# Patient Record
Sex: Female | Born: 1960 | Race: White | Hispanic: No | Marital: Married | Smoking: Never smoker
Health system: Southern US, Community
[De-identification: ages and names within clinical notes are randomized; demographics above are authoritative.]

## PROBLEM LIST (undated history)

## (undated) DIAGNOSIS — M8580 Other specified disorders of bone density and structure, unspecified site: Secondary | ICD-10-CM

## (undated) DIAGNOSIS — N951 Menopausal and female climacteric states: Secondary | ICD-10-CM

## (undated) DIAGNOSIS — F419 Anxiety disorder, unspecified: Secondary | ICD-10-CM

## (undated) DIAGNOSIS — F32A Depression, unspecified: Secondary | ICD-10-CM

## (undated) DIAGNOSIS — E119 Type 2 diabetes mellitus without complications: Secondary | ICD-10-CM

## (undated) DIAGNOSIS — E785 Hyperlipidemia, unspecified: Secondary | ICD-10-CM

## (undated) DIAGNOSIS — E669 Obesity, unspecified: Secondary | ICD-10-CM

## (undated) DIAGNOSIS — G4700 Insomnia, unspecified: Secondary | ICD-10-CM

## (undated) DIAGNOSIS — G43909 Migraine, unspecified, not intractable, without status migrainosus: Secondary | ICD-10-CM

## (undated) DIAGNOSIS — Z973 Presence of spectacles and contact lenses: Secondary | ICD-10-CM

## (undated) DIAGNOSIS — IMO0002 Reserved for concepts with insufficient information to code with codable children: Secondary | ICD-10-CM

## (undated) DIAGNOSIS — K219 Gastro-esophageal reflux disease without esophagitis: Secondary | ICD-10-CM

## (undated) DIAGNOSIS — M1990 Unspecified osteoarthritis, unspecified site: Secondary | ICD-10-CM

## (undated) DIAGNOSIS — I10 Essential (primary) hypertension: Secondary | ICD-10-CM

## (undated) HISTORY — DX: Insomnia, unspecified: G47.00

## (undated) HISTORY — DX: Type 2 diabetes mellitus without complications: E11.9

## (undated) HISTORY — DX: Other specified disorders of bone density and structure, unspecified site: M85.80

## (undated) HISTORY — DX: Depression, unspecified: F32.A

## (undated) HISTORY — DX: Obesity, unspecified: E66.9

## (undated) HISTORY — DX: Menopausal and female climacteric states: N95.1

## (undated) HISTORY — DX: Gastro-esophageal reflux disease without esophagitis: K21.9

## (undated) HISTORY — DX: Anxiety disorder, unspecified: F41.9

---

## 1998-01-27 ENCOUNTER — Other Ambulatory Visit: Admission: RE | Admit: 1998-01-27 | Discharge: 1998-01-27 | Payer: Self-pay | Admitting: Gynecology

## 2002-11-09 ENCOUNTER — Other Ambulatory Visit: Admission: RE | Admit: 2002-11-09 | Discharge: 2002-11-09 | Payer: Self-pay | Admitting: Gynecology

## 2004-01-11 ENCOUNTER — Other Ambulatory Visit: Admission: RE | Admit: 2004-01-11 | Discharge: 2004-01-11 | Payer: Self-pay | Admitting: Gynecology

## 2006-01-10 ENCOUNTER — Emergency Department (HOSPITAL_COMMUNITY): Admission: EM | Admit: 2006-01-10 | Discharge: 2006-01-10 | Payer: Self-pay | Admitting: Emergency Medicine

## 2007-02-05 ENCOUNTER — Other Ambulatory Visit: Admission: RE | Admit: 2007-02-05 | Discharge: 2007-02-05 | Payer: Self-pay | Admitting: Gynecology

## 2008-02-11 ENCOUNTER — Other Ambulatory Visit: Admission: RE | Admit: 2008-02-11 | Discharge: 2008-02-11 | Payer: Self-pay | Admitting: Gynecology

## 2008-08-26 ENCOUNTER — Encounter: Admission: RE | Admit: 2008-08-26 | Discharge: 2008-08-26 | Payer: Self-pay | Admitting: Family Medicine

## 2009-12-13 ENCOUNTER — Ambulatory Visit (HOSPITAL_BASED_OUTPATIENT_CLINIC_OR_DEPARTMENT_OTHER): Admission: RE | Admit: 2009-12-13 | Discharge: 2009-12-13 | Payer: Self-pay | Admitting: Orthopedic Surgery

## 2009-12-13 HISTORY — PX: OTHER SURGICAL HISTORY: SHX169

## 2010-01-24 ENCOUNTER — Ambulatory Visit: Payer: Self-pay | Admitting: Family Medicine

## 2010-01-24 DIAGNOSIS — M76899 Other specified enthesopathies of unspecified lower limb, excluding foot: Secondary | ICD-10-CM | POA: Insufficient documentation

## 2010-01-24 DIAGNOSIS — M169 Osteoarthritis of hip, unspecified: Secondary | ICD-10-CM

## 2010-01-24 DIAGNOSIS — IMO0002 Reserved for concepts with insufficient information to code with codable children: Secondary | ICD-10-CM | POA: Insufficient documentation

## 2010-02-03 ENCOUNTER — Encounter: Admission: RE | Admit: 2010-02-03 | Discharge: 2010-02-03 | Payer: Self-pay | Admitting: Family Medicine

## 2010-04-25 NOTE — Assessment & Plan Note (Signed)
Summary: NP,RT OUTSIDE THIGH PAIN,MC   Vital Signs:  Patient profile:   50 year old female Height:      61 inches Weight:      162 pounds BMI:     30.72 Pulse rate:   92 / minute BP sitting:   115 / 76  (left arm)  Vitals Entered By: Rochele Pages RN (January 24, 2010 1:59 PM) CC: rt thigh pain x 3 weeks   History of Present Illness: 50 year old female seen at the request of Dr. Philipp Deputy from [place] for evaluation of right thigh and hip pain for 3 weeks.   The patient was lifting some boxes about 3 weeks ago, and she felt some pain in her hip more in the anterior aspect. Now she is having lateral hip pain as well, deep and aching, some distributed in and around just distal to lateral thigh.  No trauma, bruising, or swelling. Worse with standing and swelling. Heat and ice have not been helpful.  She is not clear about what medications she is taking and does not have bottles.   No history of fracture or operative intervention in the effected hip. She does describe deep groin pain on the right.   No back pain. No radiculopathy.  Allergies (verified): No Known Drug Allergies  Family History: n/c  Review of Systems       REVIEW OF SYSTEMS  GEN: No systemic complaints, no fevers, chills, sweats, or other acute illnesses MSK: Detailed in the HPI GI: tolerating PO intake without difficulty Neuro: No numbness, parasthesias, or tingling associated. Otherwise the pertinent positives of the ROS are noted above.    Physical Exam  General:  Well-developed,well-nourished,in no acute distress; alert,appropriate and cooperative throughout examination Head:  Normocephalic and atraumatic without obvious abnormalities. No apparent alopecia or balding. Ears:  no external deformities.   Nose:  no external deformity.   Lungs:  normal respiratory effort.   Msk:  Lumbar spine: Full flexion, extension, lateral rotation, lateral bending. NT sciatic notches. NT along the spinous processes. Neg  SLR.   HIP EXAM: SIDE: R ROM: Abduction, Flexion, Internal and External range of motion:Notable pain with terminal IROM Flexion 5-10 degrees greater on L No pain with abduction B Pain in deep hip and anteriorly with IROM GTB: markedly TTP SLR: NEG Knees: No effusion FABER: NT REVERSE FABER: Limited some with IROM pain Str: flexion: 4+/5 abduction: 4-/5, severe pain adduction: 4/5, causes pain Neurologic:  alert & oriented X3.  antalgic gait   Impression & Recommendations:  Problem # 1:  HIP STRAIN, RIGHT (ICD-843.9) Assessment New Combination of multiple pathologies causing pain. Initially, suspect hip flexor strain, ttp at insertion, mildly. + C sign with pain c/w some intraarticular pathology.  Trochanteric bursitis is likely secondary, but now seems primary main cause of pain.  Recommendations: Patient to call back with meds she is using - may change antiinflammatory class.  Reviewed hip rehab protocol with the patient.  GTB injection. Plain xrays to evaluate joint space.  recheck 3-4 weeks  cc: Dr. Clelia Croft  Problem # 2:  TROCHANTERIC BURSITIS, RIGHT (ICD-726.5) Assessment: New Trochanteric Bursitis Injection Verbal consent obtained. Risks, benefits, and alternatives reviewed. R greater trochanter sterilely prepped with Betadine. Ethyl Chloride used for anesthesia. 4 cc of Lidocaine 1% injected with 1 cc of 40 mg Kenalog into trochanteric bursa at area of maximal tenderness at greater trochanter. Needle taken to bone to troch bursa, flows easily. Bursa massaged. No bleeding and no complications.  Decreased pain after injection. Needle: 22 gauge spinal needle   Problem # 3:  OSTEOARTHRITIS, HIP, RIGHT (ICD-715.95) Assessment: New  Orders: Joint Aspirate / Injection, Large (20610) Kenalog 10mg  (4units) (J3301)   Orders Added: 1)  Consultation Level III [95284] 2)  Joint Aspirate / Injection, Large [20610] 3)  Kenalog 10mg  (4units) [J3301]

## 2010-04-25 NOTE — Letter (Signed)
Summary: [facility]   Imported By: Marily Memos 01/24/2010 13:48:26  _____________________________________________________________________  External Attachment:    Type:   Image     Comment:   External Document

## 2010-06-08 LAB — BASIC METABOLIC PANEL
Chloride: 98 mEq/L (ref 96–112)
GFR calc non Af Amer: >60 mL/min (ref >60)
Potassium: 4.3 mEq/L (ref 3.5–5.1)
Sodium: 133 mEq/L — ABNORMAL LOW (ref 135–145)

## 2010-06-08 LAB — POCT HEMOGLOBIN-HEMACUE: Hemoglobin: 12.9 g/dL (ref 12.0–15.0)

## 2014-12-21 ENCOUNTER — Encounter (HOSPITAL_BASED_OUTPATIENT_CLINIC_OR_DEPARTMENT_OTHER): Payer: Self-pay | Admitting: *Deleted

## 2014-12-21 NOTE — Progress Notes (Signed)
NPO AFTER MN WITH EXCEPTION CLEAR LIQUIDS UNTIL 0700 (NO CREAM/MILK PRODUCTS).  ARRIVE AT 1130.  NEEDS ISTAT AND EKG. WILL TAKE AM MEDS W/ SIPS OF WATER.

## 2014-12-30 ENCOUNTER — Ambulatory Visit (HOSPITAL_BASED_OUTPATIENT_CLINIC_OR_DEPARTMENT_OTHER): Payer: BLUE CROSS/BLUE SHIELD | Admitting: Anesthesiology

## 2014-12-30 ENCOUNTER — Encounter (HOSPITAL_BASED_OUTPATIENT_CLINIC_OR_DEPARTMENT_OTHER)
Admission: RE | Disposition: A | Discharge status: Home / Self Care | Payer: Self-pay | Source: Ambulatory Visit | Attending: Orthopedic Surgery

## 2014-12-30 ENCOUNTER — Ambulatory Visit (HOSPITAL_BASED_OUTPATIENT_CLINIC_OR_DEPARTMENT_OTHER)
Admission: RE | Admit: 2014-12-30 | Discharge: 2014-12-30 | Disposition: A | Discharge status: Home / Self Care | Payer: BLUE CROSS/BLUE SHIELD | Source: Ambulatory Visit | Attending: Orthopedic Surgery | Admitting: Orthopedic Surgery

## 2014-12-30 ENCOUNTER — Encounter (HOSPITAL_BASED_OUTPATIENT_CLINIC_OR_DEPARTMENT_OTHER): Payer: Self-pay

## 2014-12-30 DIAGNOSIS — G43909 Migraine, unspecified, not intractable, without status migrainosus: Secondary | ICD-10-CM | POA: Diagnosis not present

## 2014-12-30 DIAGNOSIS — E785 Hyperlipidemia, unspecified: Secondary | ICD-10-CM | POA: Insufficient documentation

## 2014-12-30 DIAGNOSIS — I10 Essential (primary) hypertension: Secondary | ICD-10-CM | POA: Diagnosis not present

## 2014-12-30 DIAGNOSIS — M1990 Unspecified osteoarthritis, unspecified site: Secondary | ICD-10-CM | POA: Diagnosis not present

## 2014-12-30 DIAGNOSIS — M20012 Mallet finger of left finger(s): Secondary | ICD-10-CM | POA: Insufficient documentation

## 2014-12-30 HISTORY — DX: Hyperlipidemia, unspecified: E78.5

## 2014-12-30 HISTORY — DX: Migraine, unspecified, not intractable, without status migrainosus: G43.909

## 2014-12-30 HISTORY — DX: Essential (primary) hypertension: I10

## 2014-12-30 HISTORY — PX: CLOSED REDUCTION FINGER WITH PERCUTANEOUS PINNING: SHX5612

## 2014-12-30 HISTORY — DX: Presence of spectacles and contact lenses: Z97.3

## 2014-12-30 HISTORY — DX: Reserved for concepts with insufficient information to code with codable children: IMO0002

## 2014-12-30 HISTORY — DX: Unspecified osteoarthritis, unspecified site: M19.90

## 2014-12-30 LAB — POCT I-STAT 4, (NA,K, GLUC, HGB,HCT)
GLUCOSE: 103 mg/dL — AB (ref 65–99)
HCT: 42 % (ref 36.0–46.0)
Hemoglobin: 14.3 g/dL (ref 12.0–15.0)
Potassium: 3.9 mmol/L (ref 3.5–5.1)
Sodium: 138 mmol/L (ref 135–145)

## 2014-12-30 SURGERY — CLOSED REDUCTION, FINGER, WITH PERCUTANEOUS PINNING
Anesthesia: General | Site: Finger | Laterality: Left

## 2014-12-30 MED ORDER — LACTATED RINGERS IV SOLN
INTRAVENOUS | Status: DC
  Administered 2014-12-30: 12:00:00 via INTRAVENOUS
  Filled 2014-12-30: qty 1000

## 2014-12-30 MED ORDER — CHLORHEXIDINE GLUCONATE 4 % EX LIQD
60.0000 mL | Freq: Once | CUTANEOUS | Status: DC
  Filled 2014-12-30: qty 60

## 2014-12-30 MED ORDER — CEFAZOLIN SODIUM-DEXTROSE 2-3 GM-% IV SOLR
INTRAVENOUS | Status: AC
  Filled 2014-12-30: qty 50

## 2014-12-30 MED ORDER — LACTATED RINGERS IV SOLN
INTRAVENOUS | Status: DC
  Filled 2014-12-30: qty 1000

## 2014-12-30 MED ORDER — BUPIVACAINE HCL (PF) 0.25 % IJ SOLN
INTRAMUSCULAR | Status: DC | PRN
  Administered 2014-12-30: 5 mL

## 2014-12-30 MED ORDER — LIDOCAINE HCL (CARDIAC) 20 MG/ML IV SOLN
INTRAVENOUS | Status: DC | PRN
  Administered 2014-12-30: 50 mg via INTRAVENOUS

## 2014-12-30 MED ORDER — MIDAZOLAM HCL 2 MG/2ML IJ SOLN
INTRAMUSCULAR | Status: AC
  Filled 2014-12-30: qty 2

## 2014-12-30 MED ORDER — HYDROCODONE-ACETAMINOPHEN 5-300 MG PO TABS: 1.0000 | ORAL_TABLET | Freq: Four times a day (QID) | ORAL | Status: DC | PRN

## 2014-12-30 MED ORDER — FENTANYL CITRATE (PF) 100 MCG/2ML IJ SOLN
INTRAMUSCULAR | Status: DC | PRN
  Administered 2014-12-30: 50 ug via INTRAVENOUS

## 2014-12-30 MED ORDER — PROPOFOL 10 MG/ML IV BOLUS
INTRAVENOUS | Status: DC | PRN
  Administered 2014-12-30: 80 mg via INTRAVENOUS

## 2014-12-30 MED ORDER — FENTANYL CITRATE (PF) 100 MCG/2ML IJ SOLN
INTRAMUSCULAR | Status: AC
Start: 2014-12-30 — End: 2014-12-30
  Filled 2014-12-30: qty 2

## 2014-12-30 MED ORDER — MIDAZOLAM HCL 5 MG/5ML IJ SOLN
INTRAMUSCULAR | Status: DC | PRN
  Administered 2014-12-30: 2 mg via INTRAVENOUS

## 2014-12-30 MED ORDER — CEFAZOLIN SODIUM-DEXTROSE 2-3 GM-% IV SOLR
2.0000 g | INTRAVENOUS | Status: AC
  Administered 2014-12-30: 2 g via INTRAVENOUS
  Filled 2014-12-30: qty 50

## 2014-12-30 MED ORDER — LIDOCAINE HCL 1 % IJ SOLN
INTRAMUSCULAR | Status: DC | PRN
  Administered 2014-12-30: 5 mL

## 2014-12-30 MED ORDER — FENTANYL CITRATE (PF) 100 MCG/2ML IJ SOLN
25.0000 ug | INTRAMUSCULAR | Status: DC | PRN
  Filled 2014-12-30: qty 1

## 2014-12-30 SURGICAL SUPPLY — 64 items
BANDAGE CONFORM 3  STR LF (GAUZE/BANDAGES/DRESSINGS) IMPLANT
BANDAGE ELASTIC 3 VELCRO ST LF (GAUZE/BANDAGES/DRESSINGS) IMPLANT
BANDAGE ELASTIC 4 VELCRO ST LF (GAUZE/BANDAGES/DRESSINGS) IMPLANT
BANDAGE GAUZE ELAST BULKY 4 IN (GAUZE/BANDAGES/DRESSINGS) IMPLANT
BENZOIN TINCTURE PRP APPL 2/3 (GAUZE/BANDAGES/DRESSINGS) IMPLANT
BLADE SURG 15 STRL LF DISP TIS (BLADE) ×1 IMPLANT
BLADE SURG 15 STRL SS (BLADE) ×1
BNDG COHESIVE 1X5 TAN STRL LF (GAUZE/BANDAGES/DRESSINGS) ×2 IMPLANT
BNDG COHESIVE 3X5 TAN STRL LF (GAUZE/BANDAGES/DRESSINGS) IMPLANT
BNDG CONFORM 2 STRL LF (GAUZE/BANDAGES/DRESSINGS) ×2 IMPLANT
BNDG ESMARK 4X9 LF (GAUZE/BANDAGES/DRESSINGS) IMPLANT
BNDG GAUZE ELAST 4 BULKY (GAUZE/BANDAGES/DRESSINGS) IMPLANT
CLOTH BEACON ORANGE TIMEOUT ST (SAFETY) IMPLANT
CORDS BIPOLAR (ELECTRODE) IMPLANT
COVER BACK TABLE 60X90IN (DRAPES) ×2 IMPLANT
CUFF TOURNIQUET SINGLE 18IN (TOURNIQUET CUFF) ×2 IMPLANT
CUFF TOURNIQUET SINGLE 24IN (TOURNIQUET CUFF) IMPLANT
DRAIN PENROSE 18X1/4 LTX STRL (WOUND CARE) IMPLANT
DRAPE EXTREMITY T 121X128X90 (DRAPE) ×2 IMPLANT
DRAPE OEC MINIVIEW 54X84 (DRAPES) IMPLANT
DRAPE SURG 17X23 STRL (DRAPES) IMPLANT
DRSG EMULSION OIL 3X3 NADH (GAUZE/BANDAGES/DRESSINGS) IMPLANT
DRSG TEGADERM 2-3/8X2-3/4 SM (GAUZE/BANDAGES/DRESSINGS) ×2 IMPLANT
ELECT NEEDLE TIP 2.8 STRL (NEEDLE) IMPLANT
ELECT REM PT RETURN 9FT ADLT (ELECTROSURGICAL)
ELECTRODE REM PT RTRN 9FT ADLT (ELECTROSURGICAL) IMPLANT
GAUZE SPONGE 4X4 16PLY XRAY LF (GAUZE/BANDAGES/DRESSINGS) IMPLANT
GAUZE XEROFORM 1X8 LF (GAUZE/BANDAGES/DRESSINGS) ×2 IMPLANT
GLOVE BIO SURGEON STRL SZ8 (GLOVE) ×2 IMPLANT
GLOVE BIOGEL PI IND STRL 7.5 (GLOVE) ×2 IMPLANT
GLOVE BIOGEL PI IND STRL 8.5 (GLOVE) ×1 IMPLANT
GLOVE BIOGEL PI INDICATOR 7.5 (GLOVE) ×2
GLOVE BIOGEL PI INDICATOR 8.5 (GLOVE) ×1
GLOVE SURG SS PI 7.5 STRL IVOR (GLOVE) ×2 IMPLANT
GOWN STRL REUS W/TWL LRG LVL3 (GOWN DISPOSABLE) ×2 IMPLANT
GOWN STRL REUS W/TWL XL LVL3 (GOWN DISPOSABLE) ×2 IMPLANT
K-WIRE .035X4 (WIRE) IMPLANT
K-WIRE .045X4 (WIRE) IMPLANT
K-WIRE 0.45 VIRAK (Wire) ×2 IMPLANT
LOOP VESSEL MAXI BLUE (MISCELLANEOUS) IMPLANT
MANIFOLD NEPTUNE II (INSTRUMENTS) IMPLANT
NEEDLE HYPO 25X1 1.5 SAFETY (NEEDLE) IMPLANT
NS IRRIG 500ML POUR BTL (IV SOLUTION) ×2 IMPLANT
PACK BASIN DAY SURGERY FS (CUSTOM PROCEDURE TRAY) ×2 IMPLANT
PAD CAST 3X4 CTTN HI CHSV (CAST SUPPLIES) IMPLANT
PADDING CAST ABS 4INX4YD NS (CAST SUPPLIES)
PADDING CAST ABS COTTON 4X4 ST (CAST SUPPLIES) IMPLANT
PADDING CAST COTTON 3X4 STRL (CAST SUPPLIES)
PENCIL BUTTON HOLSTER BLD 10FT (ELECTRODE) IMPLANT
SPLINT PLASTER CAST XFAST 3X15 (CAST SUPPLIES) IMPLANT
SPLINT PLASTER CAST XFAST 4X15 (CAST SUPPLIES) IMPLANT
SPLINT PLASTER XTRA FAST SET 4 (CAST SUPPLIES)
SPLINT PLASTER XTRA FASTSET 3X (CAST SUPPLIES)
SPONGE GAUZE 4X4 12PLY (GAUZE/BANDAGES/DRESSINGS) ×2 IMPLANT
STOCKINETTE 4X48 STRL (DRAPES) ×2 IMPLANT
STRIP CLOSURE SKIN 1/2X4 (GAUZE/BANDAGES/DRESSINGS) IMPLANT
SUT ETHILON 4 0 P 3 18 (SUTURE) IMPLANT
SUT ETHILON 5 0 P 3 18 (SUTURE)
SUT NYLON ETHILON 5-0 P-3 1X18 (SUTURE) IMPLANT
SYR BULB 3OZ (MISCELLANEOUS) ×2 IMPLANT
SYR CONTROL 10ML LL (SYRINGE) IMPLANT
TOWEL OR 17X24 6PK STRL BLUE (TOWEL DISPOSABLE) ×4 IMPLANT
UNDERPAD 30X30 INCONTINENT (UNDERPADS AND DIAPERS) ×2 IMPLANT
WATER STERILE IRR 500ML POUR (IV SOLUTION) IMPLANT

## 2014-12-30 NOTE — H&P (Signed)
Tiffany Travis is an 54 y.o. female.   Chief Complaint: left small finger tendon injury HPI: Pt followed in office Pt with soft tissue mallet with persistent deformity Pt here for surgery on left small finger No prior surgery to left small finger  Past Medical History  Diagnosis Date  . Tendon injury     left small finger  . Hypertension   . Migraine   . Wears contact lenses   . Arthritis   . Hyperlipidemia     Past Surgical History  Procedure Laterality Date  . Orif right long finger fx and tenolysis  12-13-2009    History reviewed. No pertinent family history. Social History:  reports that she has never smoked. She has never used smokeless tobacco. She reports that she does not drink alcohol or use illicit drugs.  Allergies: No Known Allergies  No prescriptions prior to admission    No results found for this or any previous visit (from the past 48 hour(s)). No results found.  ROSNO RECENT ILLNESSES OR HOSPITALIZATIONS  Height 5' 1.5" (1.562 m), weight 71.668 kg (158 lb). Physical Exam  General Appearance:  Alert, cooperative, no distress, appears stated age  Head:  Normocephalic, without obvious abnormality, atraumatic  Eyes:  Pupils equal, conjunctiva/corneas clear,         Throat: Lips, mucosa, and tongue normal; teeth and gums normal  Neck: No visible masses     Lungs:   respirations unlabored  Chest Wall:  No tenderness or deformity  Heart:  Regular rate and rhythm,  Abdomen:   Soft, non-tender,         Extremities: LEFT HAND: SKIN INTACT, MALLET DEFORMITY TO SMALL FINGER, FINGER WARM WELL PERFUSED ABLE TO FLEX DIP AND PIP JOINT TO SMALL FINGER GOOD MOBILITY TO INDEX/LONG/RING AND THUMB  Pulses: 2+ and symmetric  Skin: Skin color, texture, turgor normal, no rashes or lesions     Neurologic: Normal   Assessment/Plan LEFT SMALL FINGER CHRONIC MALLET FINGER  LEFT SMALL FINGER CLOSED REDUCTION AND PINNING POSSIBLE OPEN REPAIR  R/B/A DISCUSSED WITH PT  IN OFFICE.  PT VOICED UNDERSTANDING OF PLAN CONSENT SIGNED DAY OF SURGERY PT SEEN AND EXAMINED PRIOR TO OPERATIVE PROCEDURE/DAY OF SURGERY SITE MARKED. QUESTIONS ANSWERED WILL GO HOME FOLLOWING SURGERY  WE ARE PLANNING SURGERY FOR YOUR UPPER EXTREMITY. THE RISKS AND BENEFITS OF SURGERY INCLUDE BUT NOT LIMITED TO BLEEDING INFECTION, DAMAGE TO NEARBY NERVES ARTERIES TENDONS, FAILURE OF SURGERY TO ACCOMPLISH ITS INTENDED GOALS, PERSISTENT SYMPTOMS AND NEED FOR FURTHER SURGICAL INTERVENTION. WITH THIS IN MIND WE WILL PROCEED. I HAVE DISCUSSED WITH THE PATIENT THE PRE AND POSTOPERATIVE REGIMEN AND THE DOS AND DON'TS. PT VOICED UNDERSTANDING AND INFORMED CONSENT SIGNED.  Sharma Covert 12/30/2014, 1334PM

## 2014-12-30 NOTE — Anesthesia Procedure Notes (Signed)
Performed by: Maris Berger T Oxygen Delivery Method: Nasal cannula Placement Confirmation: positive ETCO2

## 2014-12-30 NOTE — Anesthesia Preprocedure Evaluation (Signed)
Anesthesia Evaluation  Patient identified by MRN, date of birth, ID band Patient awake    Reviewed: Allergy & Precautions, H&P , NPO status , Patient's Chart, lab work & pertinent test results  Airway Mallampati: II  TM Distance: >3 FB Neck ROM: full    Dental no notable dental hx. (+) Dental Advisory Given, Teeth Intact   Pulmonary neg pulmonary ROS,    Pulmonary exam normal breath sounds clear to auscultation       Cardiovascular Exercise Tolerance: Good hypertension, Pt. on medications Normal cardiovascular exam Rhythm:regular Rate:Normal     Neuro/Psych negative neurological ROS  negative psych ROS   GI/Hepatic negative GI ROS, Neg liver ROS,   Endo/Other  negative endocrine ROS  Renal/GU negative Renal ROS  negative genitourinary   Musculoskeletal   Abdominal   Peds  Hematology negative hematology ROS (+)   Anesthesia Other Findings   Reproductive/Obstetrics negative OB ROS                             Anesthesia Physical Anesthesia Plan  ASA: II  Anesthesia Plan: General   Post-op Pain Management:    Induction: Intravenous  Airway Management Planned: LMA  Additional Equipment:   Intra-op Plan:   Post-operative Plan:   Informed Consent: I have reviewed the patients History and Physical, chart, labs and discussed the procedure including the risks, benefits and alternatives for the proposed anesthesia with the patient or authorized representative who has indicated his/her understanding and acceptance.   Dental Advisory Given  Plan Discussed with: CRNA and Surgeon  Anesthesia Plan Comments:         Anesthesia Quick Evaluation

## 2014-12-30 NOTE — Transfer of Care (Signed)
Immediate Anesthesia Transfer of Care Note  Patient: Tiffany Travis  Procedure(s) Performed: Procedure(s): LEFT SMALL FINGER CLOSED REDUCTION AND PINNING (Left)  Patient Location: PACU and Short Stay  Anesthesia Type:MAC  Level of Consciousness: awake, alert  and oriented  Airway & Oxygen Therapy: Patient Spontanous Breathing  Post-op Assessment: Report given to RN  Post vital signs: Reviewed and stable  Last Vitals:  Filed Vitals:   12/30/14 1136  BP: 124/70  Pulse: 68  Temp: 36.9 C  Resp: 12    Complications: No apparent anesthesia complications

## 2014-12-30 NOTE — Brief Op Note (Signed)
12/30/2014  8:11 AM  PATIENT:  Tiffany Travis  54 y.o. female  PRE-OPERATIVE DIAGNOSIS:  LEFT SMALL FINGER MALLET FINGER   POST-OPERATIVE DIAGNOSIS:  * No post-op diagnosis entered *  PROCEDURE:  Procedure(s): LEFT SMALL FINGER CLOSED REDUCTION AND PINNING (Left) POSSIBLE OPEN REPAIR  (Left)  SURGEON:  Surgeon(s) and Role:    * Bradly Bienenstock, MD - Primary  PHYSICIAN ASSISTANT:   ASSISTANTS: none   ANESTHESIA:   general  EBL:     BLOOD ADMINISTERED:none  DRAINS: none   LOCAL MEDICATIONS USED:  MARCAINE     SPECIMEN:  No Specimen  DISPOSITION OF SPECIMEN:  N/A  COUNTS:  YES  TOURNIQUET:  * No tourniquets in log *  DICTATION: .Seleta Rhymes  PLAN OF CARE: Discharge to home after PACU  PATIENT DISPOSITION:  PACU - hemodynamically stable.   Delay start of Pharmacological VTE agent (>24hrs) due to surgical blood loss or risk of bleeding: not applicable

## 2014-12-30 NOTE — Discharge Instructions (Signed)
KEEP BANDAGE CLEAN AND DRY CALL OFFICE FOR F/U APPT [phone] in 8 days DR Melvyn Novas [phone] KEEP HAND ELEVATED ABOVE HEART OK TO APPLY ICE TO OPERATIVE AREA CONTACT OFFICE IF ANY WORSENING PAIN OR CONCERNS.        HAND SURGERY    HOME CARE INSTRUCTIONS    The following instructions have been prepared to help you care for yourself upon your return home today.  Wound Care:  Keep your hand elevated above the level of your heart. Do not allow it to dangle by your side. Keep the dressing dry and do not remove it unless your doctor advises you to do so. He will usually change it at the time of you post-op visit. Moving your fingers is advised to stimulate circulation but will depend on the site of your surgery. Of course, if you have a splint applied your doctor will advise you about movement.  Activity:  Do not drive or operate machinery today. Rest today and then you may return to your normal activity and work as indicated by your physician.  Diet: Drink liquids today or eat a light diet. You may resume a regular diet tomorrow.  General expectations: Pain for two or three days. Fingers may become slightly swollen.   Unexpected Observations- Call your doctor if any of these occur: Severe pain not relieved by pain medication. Elevated temperature. Dressing soaked with blood. Inability to move fingers. White or bluish color to fingers.      Post Anesthesia Home Care Instructions  Activity: Get plenty of rest for the remainder of the day. A responsible adult should stay with you for 24 hours following the procedure.  For the next 24 hours, DO NOT: -Drive a car -Advertising copywriter -Drink alcoholic beverages -Take any medication unless instructed by your physician -Make any legal decisions or sign important papers.  Meals: Start with liquid foods such as gelatin or soup. Progress to regular foods as tolerated. Avoid greasy, spicy, heavy foods. If nausea and/or vomiting  occur, drink only clear liquids until the nausea and/or vomiting subsides. Call your physician if vomiting continues.  Special Instructions/Symptoms: Your throat may feel dry or sore from the anesthesia or the breathing tube placed in your throat during surgery. If this causes discomfort, gargle with warm salt water. The discomfort should disappear within 24 hours.  If you had a scopolamine patch placed behind your ear for the management of post- operative nausea and/or vomiting:  1. The medication in the patch is effective for 72 hours, after which it should be removed.  Wrap patch in a tissue and discard in the trash. Wash hands thoroughly with soap and water. 2. You may remove the patch earlier than 72 hours if you experience unpleasant side effects which may include dry mouth, dizziness or visual disturbances. 3. Avoid touching the patch. Wash your hands with soap and water after contact with the patch.

## 2014-12-30 NOTE — Anesthesia Postprocedure Evaluation (Signed)
  Anesthesia Post-op Note  Patient: Tiffany Travis  Procedure(s) Performed: Procedure(s) (LRB): LEFT SMALL FINGER CLOSED REDUCTION AND PINNING (Left)  Patient Location: PACU  Anesthesia Type: MAC  Level of Consciousness: awake and alert   Airway and Oxygen Therapy: Patient Spontanous Breathing  Post-op Pain: mild  Post-op Assessment: Post-op Vital signs reviewed, Patient's Cardiovascular Status Stable, Respiratory Function Stable, Patent Airway and No signs of Nausea or vomiting  Last Vitals:  Filed Vitals:   12/30/14 1509  BP: 123/64  Pulse: 66  Temp: 36.9 C  Resp: 16    Post-op Vital Signs: stable   Complications: No apparent anesthesia complications

## 2014-12-31 ENCOUNTER — Encounter (HOSPITAL_BASED_OUTPATIENT_CLINIC_OR_DEPARTMENT_OTHER): Payer: Self-pay | Admitting: Orthopedic Surgery

## 2014-12-31 NOTE — Op Note (Signed)
Tiffany Travis, Tiffany Travis NO.:  [id number]  MEDICAL RECORD NO.:  [id number]  LOCATION:                               FACILITY:  [facility]  PHYSICIAN:  Sharma Covert IV, M.D.DATE OF BIRTH:  [birth year]  DATE OF PROCEDURE:  12/30/2014 DATE OF DISCHARGE:  12/30/2014                              OPERATIVE REPORT   PREOPERATIVE DIAGNOSIS:  Left soft tissue, left small finger mallet deformity.  POSTOPERATIVE DIAGNOSIS:  Left soft tissue, left small finger mallet deformity.  ATTENDING PHYSICIAN:  Sharma Covert, M.D., who scrubbed and present for the entire procedure.  ASSISTANT SURGEON:  None.  ANESTHESIA:  1% Xylocaine,0.25% Marcaine, local block with IV sedation.  SURGICAL PROCEDURE: 1. Percutaneous K-wire fixation of mallet small finger injury. 2. Radiographs, 2 views, left small finger.  SURGICAL IMPLANTS:  One 0.045 K-wire.  RADIOGRAPHIC INTERPRETATION:  AP, lateral, and oblique views of the finger did show the K-wire fixation in good position and good alignment of the distal interphalangeal joint.  SURGICAL INDICATIONS:  Tiffany Travis is a right-hand-dominant female with a persistent mallet deformity after close splinting.  The patient elected to undergo the above procedure.  Risks, benefits, and alternatives were discussed in detail with the patient.  Signed informed consent was obtained.  Risks include, but not limited to bleeding; infection; damage to nearby nerves, arteries, or tendons; nail bed injury; need for further surgical intervention.  DESCRIPTION OF PROCEDURE:  The patient was properly identified in the preop holding area, marked with a permanent marker made on the left small finger to indicate the correct operative site.  The patient was brought back to the operating room, placed supine on anesthesia room table.  IV sedation was administered.  The patient tolerated this well. A well-padded tourniquet was placed on the left forearm and sealed  with 1000 drape.  Local anesthetic was administered.  The left upper extremity was then prepped and draped in normal sterile fashion.  Time- out was called, correct side was identified, and procedure was begun. Attention then turned to the left small finger.  Closed manipulation was then performed.  Once this was carried out, a 0.045 K-wire was then placed across the distal interphalangeal joint with good purchase into the middle phalanx.  The wound was then thoroughly irrigated.  The K- wire was then cut and then left underneath the skin.  Once this was carried out, the final radiographs were then printed and interpreted. Sterile compressive bandage was applied.  The patient tolerated the procedure well.  POSTPROCEDURE PLAN:  The patient discharged home, seen back in the office in approximately 8 days for wound check.  Splint for the distal interphalangeal joint.  X-rays out of the dressing.  Total immobilization for a total of 8 weeks and take the pin out in the 8-week mark.  Radiographs at each visit.     Madelynn Done, M.D.     FWO/MEDQ  D:  12/30/2014  T:  12/31/2014  Job:  [id number]

## 2015-07-11 DIAGNOSIS — F322 Major depressive disorder, single episode, severe without psychotic features: Secondary | ICD-10-CM | POA: Diagnosis not present

## 2015-08-04 DIAGNOSIS — I10 Essential (primary) hypertension: Secondary | ICD-10-CM | POA: Diagnosis not present

## 2015-08-04 DIAGNOSIS — E782 Mixed hyperlipidemia: Secondary | ICD-10-CM | POA: Diagnosis not present

## 2015-08-04 DIAGNOSIS — Z0000 Encounter for general adult medical examination without abnormal findings: Secondary | ICD-10-CM | POA: Diagnosis not present

## 2015-08-04 DIAGNOSIS — F322 Major depressive disorder, single episode, severe without psychotic features: Secondary | ICD-10-CM | POA: Diagnosis not present

## 2015-08-04 DIAGNOSIS — R7301 Impaired fasting glucose: Secondary | ICD-10-CM | POA: Diagnosis not present

## 2015-11-29 DIAGNOSIS — Z1231 Encounter for screening mammogram for malignant neoplasm of breast: Secondary | ICD-10-CM | POA: Diagnosis not present

## 2015-12-30 DIAGNOSIS — Z23 Encounter for immunization: Secondary | ICD-10-CM | POA: Diagnosis not present

## 2016-02-22 ENCOUNTER — Other Ambulatory Visit (HOSPITAL_COMMUNITY): Payer: Self-pay | Admitting: Family Medicine

## 2016-02-22 DIAGNOSIS — R7301 Impaired fasting glucose: Secondary | ICD-10-CM | POA: Diagnosis not present

## 2016-02-22 DIAGNOSIS — F322 Major depressive disorder, single episode, severe without psychotic features: Secondary | ICD-10-CM | POA: Diagnosis not present

## 2016-02-22 DIAGNOSIS — R1310 Dysphagia, unspecified: Secondary | ICD-10-CM

## 2016-02-22 DIAGNOSIS — E782 Mixed hyperlipidemia: Secondary | ICD-10-CM | POA: Diagnosis not present

## 2016-02-22 DIAGNOSIS — I10 Essential (primary) hypertension: Secondary | ICD-10-CM | POA: Diagnosis not present

## 2016-02-27 ENCOUNTER — Ambulatory Visit (HOSPITAL_COMMUNITY)
Admission: RE | Admit: 2016-02-27 | Discharge: 2016-02-27 | Disposition: A | Discharge status: Home / Self Care | Payer: BLUE CROSS/BLUE SHIELD | Source: Ambulatory Visit | Attending: Family Medicine | Admitting: Family Medicine

## 2016-02-27 DIAGNOSIS — R1310 Dysphagia, unspecified: Secondary | ICD-10-CM | POA: Insufficient documentation

## 2016-02-27 DIAGNOSIS — K449 Diaphragmatic hernia without obstruction or gangrene: Secondary | ICD-10-CM | POA: Insufficient documentation

## 2016-05-25 DIAGNOSIS — R7301 Impaired fasting glucose: Secondary | ICD-10-CM | POA: Diagnosis not present

## 2016-07-02 DIAGNOSIS — F411 Generalized anxiety disorder: Secondary | ICD-10-CM | POA: Diagnosis not present

## 2016-08-10 DIAGNOSIS — E782 Mixed hyperlipidemia: Secondary | ICD-10-CM | POA: Diagnosis not present

## 2016-08-10 DIAGNOSIS — I10 Essential (primary) hypertension: Secondary | ICD-10-CM | POA: Diagnosis not present

## 2016-08-10 DIAGNOSIS — F322 Major depressive disorder, single episode, severe without psychotic features: Secondary | ICD-10-CM | POA: Diagnosis not present

## 2016-08-10 DIAGNOSIS — Z0000 Encounter for general adult medical examination without abnormal findings: Secondary | ICD-10-CM | POA: Diagnosis not present

## 2016-12-27 DIAGNOSIS — E782 Mixed hyperlipidemia: Secondary | ICD-10-CM | POA: Diagnosis not present

## 2016-12-27 DIAGNOSIS — R7301 Impaired fasting glucose: Secondary | ICD-10-CM | POA: Diagnosis not present

## 2016-12-27 DIAGNOSIS — Z23 Encounter for immunization: Secondary | ICD-10-CM | POA: Diagnosis not present

## 2016-12-27 DIAGNOSIS — I10 Essential (primary) hypertension: Secondary | ICD-10-CM | POA: Diagnosis not present

## 2016-12-27 DIAGNOSIS — F322 Major depressive disorder, single episode, severe without psychotic features: Secondary | ICD-10-CM | POA: Diagnosis not present

## 2017-05-01 DIAGNOSIS — E782 Mixed hyperlipidemia: Secondary | ICD-10-CM | POA: Diagnosis not present

## 2017-05-01 DIAGNOSIS — F322 Major depressive disorder, single episode, severe without psychotic features: Secondary | ICD-10-CM | POA: Diagnosis not present

## 2017-05-01 DIAGNOSIS — I10 Essential (primary) hypertension: Secondary | ICD-10-CM | POA: Diagnosis not present

## 2017-05-01 DIAGNOSIS — R7301 Impaired fasting glucose: Secondary | ICD-10-CM | POA: Diagnosis not present

## 2017-06-24 DIAGNOSIS — J029 Acute pharyngitis, unspecified: Secondary | ICD-10-CM | POA: Diagnosis not present

## 2017-06-24 DIAGNOSIS — R05 Cough: Secondary | ICD-10-CM | POA: Diagnosis not present

## 2017-06-24 DIAGNOSIS — Z9289 Personal history of other medical treatment: Secondary | ICD-10-CM | POA: Diagnosis not present

## 2017-06-24 DIAGNOSIS — J018 Other acute sinusitis: Secondary | ICD-10-CM | POA: Diagnosis not present

## 2017-07-01 DIAGNOSIS — R05 Cough: Secondary | ICD-10-CM | POA: Diagnosis not present

## 2017-07-01 DIAGNOSIS — J069 Acute upper respiratory infection, unspecified: Secondary | ICD-10-CM | POA: Diagnosis not present

## 2017-08-22 DIAGNOSIS — E1169 Type 2 diabetes mellitus with other specified complication: Secondary | ICD-10-CM | POA: Diagnosis not present

## 2017-08-22 DIAGNOSIS — Z0000 Encounter for general adult medical examination without abnormal findings: Secondary | ICD-10-CM | POA: Diagnosis not present

## 2017-09-11 DIAGNOSIS — Z1231 Encounter for screening mammogram for malignant neoplasm of breast: Secondary | ICD-10-CM | POA: Diagnosis not present

## 2017-10-25 DIAGNOSIS — Z01419 Encounter for gynecological examination (general) (routine) without abnormal findings: Secondary | ICD-10-CM | POA: Diagnosis not present

## 2017-12-25 DIAGNOSIS — F322 Major depressive disorder, single episode, severe without psychotic features: Secondary | ICD-10-CM | POA: Diagnosis not present

## 2017-12-25 DIAGNOSIS — Z23 Encounter for immunization: Secondary | ICD-10-CM | POA: Diagnosis not present

## 2017-12-25 DIAGNOSIS — E782 Mixed hyperlipidemia: Secondary | ICD-10-CM | POA: Diagnosis not present

## 2017-12-25 DIAGNOSIS — I10 Essential (primary) hypertension: Secondary | ICD-10-CM | POA: Diagnosis not present

## 2017-12-25 DIAGNOSIS — E1169 Type 2 diabetes mellitus with other specified complication: Secondary | ICD-10-CM | POA: Diagnosis not present

## 2018-05-14 ENCOUNTER — Ambulatory Visit
Admission: RE | Admit: 2018-05-14 | Discharge: 2018-05-14 | Disposition: A | Discharge status: Home / Self Care | Payer: BLUE CROSS/BLUE SHIELD | Source: Ambulatory Visit | Attending: Advanced Practice Midwife | Admitting: Advanced Practice Midwife

## 2018-05-14 ENCOUNTER — Other Ambulatory Visit: Payer: Self-pay | Admitting: Advanced Practice Midwife

## 2018-05-14 DIAGNOSIS — R079 Chest pain, unspecified: Secondary | ICD-10-CM | POA: Diagnosis not present

## 2018-05-14 DIAGNOSIS — F322 Major depressive disorder, single episode, severe without psychotic features: Secondary | ICD-10-CM | POA: Diagnosis not present

## 2018-08-25 DIAGNOSIS — I10 Essential (primary) hypertension: Secondary | ICD-10-CM | POA: Diagnosis not present

## 2018-08-25 DIAGNOSIS — E782 Mixed hyperlipidemia: Secondary | ICD-10-CM | POA: Diagnosis not present

## 2018-08-25 DIAGNOSIS — E1169 Type 2 diabetes mellitus with other specified complication: Secondary | ICD-10-CM | POA: Diagnosis not present

## 2018-08-27 DIAGNOSIS — Z0000 Encounter for general adult medical examination without abnormal findings: Secondary | ICD-10-CM | POA: Diagnosis not present

## 2018-09-06 DIAGNOSIS — S63633A Sprain of interphalangeal joint of left middle finger, initial encounter: Secondary | ICD-10-CM | POA: Diagnosis not present

## 2018-09-09 DIAGNOSIS — S62643D Nondisplaced fracture of proximal phalanx of left middle finger, subsequent encounter for fracture with routine healing: Secondary | ICD-10-CM | POA: Diagnosis not present

## 2018-09-09 DIAGNOSIS — M79645 Pain in left finger(s): Secondary | ICD-10-CM | POA: Diagnosis not present

## 2018-09-17 DIAGNOSIS — Z1231 Encounter for screening mammogram for malignant neoplasm of breast: Secondary | ICD-10-CM | POA: Diagnosis not present

## 2018-12-27 DIAGNOSIS — Z23 Encounter for immunization: Secondary | ICD-10-CM | POA: Diagnosis not present

## 2019-02-25 DIAGNOSIS — E1169 Type 2 diabetes mellitus with other specified complication: Secondary | ICD-10-CM | POA: Diagnosis not present

## 2019-02-25 DIAGNOSIS — E782 Mixed hyperlipidemia: Secondary | ICD-10-CM | POA: Diagnosis not present

## 2019-02-25 DIAGNOSIS — Z79899 Other long term (current) drug therapy: Secondary | ICD-10-CM | POA: Diagnosis not present

## 2019-02-27 DIAGNOSIS — E1169 Type 2 diabetes mellitus with other specified complication: Secondary | ICD-10-CM | POA: Diagnosis not present

## 2019-02-27 DIAGNOSIS — F322 Major depressive disorder, single episode, severe without psychotic features: Secondary | ICD-10-CM | POA: Diagnosis not present

## 2019-02-27 DIAGNOSIS — E782 Mixed hyperlipidemia: Secondary | ICD-10-CM | POA: Diagnosis not present

## 2019-02-27 DIAGNOSIS — I10 Essential (primary) hypertension: Secondary | ICD-10-CM | POA: Diagnosis not present

## 2019-03-23 ENCOUNTER — Ambulatory Visit: Payer: BC Managed Care – PPO | Attending: Internal Medicine

## 2019-03-23 DIAGNOSIS — Z20822 Contact with and (suspected) exposure to covid-19: Secondary | ICD-10-CM

## 2019-03-23 DIAGNOSIS — Z20828 Contact with and (suspected) exposure to other viral communicable diseases: Secondary | ICD-10-CM | POA: Diagnosis not present

## 2019-03-25 LAB — NOVEL CORONAVIRUS, NAA: SARS-CoV-2, NAA: NOT DETECTED

## 2019-08-31 DIAGNOSIS — E1169 Type 2 diabetes mellitus with other specified complication: Secondary | ICD-10-CM | POA: Diagnosis not present

## 2019-08-31 DIAGNOSIS — Z0000 Encounter for general adult medical examination without abnormal findings: Secondary | ICD-10-CM | POA: Diagnosis not present

## 2019-08-31 DIAGNOSIS — I10 Essential (primary) hypertension: Secondary | ICD-10-CM | POA: Diagnosis not present

## 2019-08-31 DIAGNOSIS — E782 Mixed hyperlipidemia: Secondary | ICD-10-CM | POA: Diagnosis not present

## 2019-09-23 DIAGNOSIS — Z1231 Encounter for screening mammogram for malignant neoplasm of breast: Secondary | ICD-10-CM | POA: Diagnosis not present

## 2019-09-30 DIAGNOSIS — R921 Mammographic calcification found on diagnostic imaging of breast: Secondary | ICD-10-CM | POA: Diagnosis not present

## 2019-10-22 DIAGNOSIS — J069 Acute upper respiratory infection, unspecified: Secondary | ICD-10-CM | POA: Diagnosis not present

## 2019-10-22 DIAGNOSIS — R05 Cough: Secondary | ICD-10-CM | POA: Diagnosis not present

## 2019-10-28 DIAGNOSIS — R921 Mammographic calcification found on diagnostic imaging of breast: Secondary | ICD-10-CM | POA: Diagnosis not present

## 2019-10-28 DIAGNOSIS — D241 Benign neoplasm of right breast: Secondary | ICD-10-CM | POA: Diagnosis not present

## 2019-11-06 DIAGNOSIS — Z01419 Encounter for gynecological examination (general) (routine) without abnormal findings: Secondary | ICD-10-CM | POA: Diagnosis not present

## 2019-12-19 DIAGNOSIS — Z23 Encounter for immunization: Secondary | ICD-10-CM | POA: Diagnosis not present

## 2020-07-01 DIAGNOSIS — L309 Dermatitis, unspecified: Secondary | ICD-10-CM | POA: Diagnosis not present

## 2020-09-01 DIAGNOSIS — E782 Mixed hyperlipidemia: Secondary | ICD-10-CM | POA: Diagnosis not present

## 2020-09-01 DIAGNOSIS — Z23 Encounter for immunization: Secondary | ICD-10-CM | POA: Diagnosis not present

## 2020-09-01 DIAGNOSIS — Z0000 Encounter for general adult medical examination without abnormal findings: Secondary | ICD-10-CM | POA: Diagnosis not present

## 2020-09-01 DIAGNOSIS — E1169 Type 2 diabetes mellitus with other specified complication: Secondary | ICD-10-CM | POA: Diagnosis not present

## 2020-09-01 DIAGNOSIS — I10 Essential (primary) hypertension: Secondary | ICD-10-CM | POA: Diagnosis not present

## 2020-12-01 DIAGNOSIS — F322 Major depressive disorder, single episode, severe without psychotic features: Secondary | ICD-10-CM | POA: Diagnosis not present

## 2020-12-01 DIAGNOSIS — G4700 Insomnia, unspecified: Secondary | ICD-10-CM | POA: Diagnosis not present

## 2020-12-01 DIAGNOSIS — R0789 Other chest pain: Secondary | ICD-10-CM | POA: Diagnosis not present

## 2020-12-01 DIAGNOSIS — F411 Generalized anxiety disorder: Secondary | ICD-10-CM | POA: Diagnosis not present

## 2020-12-08 DIAGNOSIS — Z1231 Encounter for screening mammogram for malignant neoplasm of breast: Secondary | ICD-10-CM | POA: Diagnosis not present

## 2020-12-09 ENCOUNTER — Ambulatory Visit (INDEPENDENT_AMBULATORY_CARE_PROVIDER_SITE_OTHER): Payer: BC Managed Care – PPO

## 2020-12-09 ENCOUNTER — Other Ambulatory Visit: Payer: Self-pay

## 2020-12-09 ENCOUNTER — Ambulatory Visit (INDEPENDENT_AMBULATORY_CARE_PROVIDER_SITE_OTHER): Payer: BC Managed Care – PPO | Admitting: Podiatry

## 2020-12-09 DIAGNOSIS — M792 Neuralgia and neuritis, unspecified: Secondary | ICD-10-CM

## 2020-12-09 MED ORDER — GABAPENTIN 100 MG PO CAPS: 100.0000 mg | ORAL_CAPSULE | Freq: Three times a day (TID) | ORAL | 3 refills | Status: DC

## 2020-12-09 NOTE — Progress Notes (Signed)
Subjective:  Patient ID: Tiffany Travis, female    DOB: 1960-11-20,  MRN: [id number]  Chief Complaint  Patient presents with   Foot Pain    Bilateral foot pain  PT stated that she has a constant burning sensation under the toes and some days are better than others     60 y.o. female presents with the above complaint.  Patient presents with complaint of constant burning underneath the toes that has progressed to gotten worse.  She does have history of neuropathy however is from an unknown etiology.  She has a family history of neuropathy.  She does not have diabetes or any other most common causes of neuropathy.  She states is burning shooting tingling.  It hurts with ambulation constantly hurts at night.  She is not on gabapentin she denies any other acute complaints.  She would like to discuss treatment options for this.   Review of Systems: Negative except as noted in the HPI. Denies N/V/F/Ch.  Past Medical History:  Diagnosis Date   Arthritis    Hyperlipidemia    Hypertension    Migraine    Tendon injury    left small finger   Wears contact lenses     Current Outpatient Medications:    gabapentin (NEURONTIN) 100 MG capsule, Take 1 capsule (100 mg total) by mouth 3 (three) times daily., Disp: 90 capsule, Rfl: 3   acetaminophen (TYLENOL) 500 MG tablet, Take 1,000 mg by mouth every 6 (six) hours as needed., Disp: , Rfl:    ALPRAZolam (XANAX) 0.25 MG tablet, Take 0.25-0.5 mg by mouth every morning., Disp: , Rfl:    aspirin-acetaminophen-caffeine (EXCEDRIN MIGRAINE) 250-250-65 MG tablet, Take by mouth every 6 (six) hours as needed for headache., Disp: , Rfl:    atenolol-chlorthalidone (TENORETIC) 50-25 MG tablet, Take 0.5 tablets by mouth every evening. , Disp: , Rfl:    Hydrocodone-Acetaminophen (VICODIN) 5-300 MG TABS, Take 1 tablet by mouth 4 (four) times daily as needed (PAIN)., Disp: 20 each, Rfl: 0   lisinopril (PRINIVIL,ZESTRIL) 20 MG tablet, Take 10 mg by mouth every evening.,  Disp: , Rfl:    sertraline (ZOLOFT) 100 MG tablet, Take 100 mg by mouth every morning., Disp: , Rfl:    simvastatin (ZOCOR) 20 MG tablet, Take 20 mg by mouth every evening., Disp: , Rfl:   Social History   Tobacco Use  Smoking Status Never  Smokeless Tobacco Never    No Known Allergies Objective:  There were no vitals filed for this visit. There is no height or weight on file to calculate BMI. Constitutional Well developed. Well nourished.  Vascular Dorsalis pedis pulses palpable bilaterally. Posterior tibial pulses palpable bilaterally. Capillary refill normal to all digits.  No cyanosis or clubbing noted. Pedal hair growth normal.  Neurologic Normal speech. Oriented to person, place, and time. Decreased sensation to light touch grossly present bilaterally.  Subjective component of shooting tingling burning neuropathic pain noted  Dermatologic Nails well groomed and normal in appearance. No open wounds. No skin lesions.  Orthopedic: Manual muscle testing 5 out of 5.  No   Radiographs: 3 views of skeletally mature adult bilateral foot: Midfoot arthritis noted.  Plantar heel spurring noted bilaterally.  No other bony abnormalities identified. Assessment:   1. Neuropathic pain    Plan:  Patient was evaluated and treated and all questions answered.  Neuropathic pain from unknown etiology -I explained to the patient the etiology of neuropathic pain and various treatment options were extensively discussed.  Given  the amount of pain that she is having without any relief I believe she will benefit from gabapentin.  I discussed with her the importance of gabapentin and his risk and complication.  She would like to proceed with gabapentin despite the risk and complication.  I will give her 30-day supply I discussed with her that if it is beneficial she can have a primary care physician manage the medication.  She states understanding. -Gabapentin was dispensed 3 times daily 100  mg.  No follow-ups on file.

## 2021-01-07 DIAGNOSIS — Z23 Encounter for immunization: Secondary | ICD-10-CM | POA: Diagnosis not present

## 2021-01-07 NOTE — Progress Notes (Signed)
Cardiology Office Note:    Date:  01/09/2021   ID:  Tiffany Travis, DOB 06/24/1960, MRN [id number]  PCP:  Lupita Raider, MD  Cardiologist:  None   Referring MD: Lupita Raider, MD   Chief Complaint  Patient presents with   Chest Pain     History of Present Illness:    Tiffany Travis is a 60 y.o. female with a hx of chest pain.  Background medial problems include DM II, hyperlipidemia, primary hypertension, and depression.  Tiffany Travis has been having chest pressure continuously over the last 6 months since the death of her mother.  Physical activity improved the discomfort.  She went hiking in the mountains yesterday and had no difficulty although prior to hiking the discomfort was present.  She has been very depressed.  Her job is stressful.  She is still mourning the death of her mother.  They were very close.  She has never before experienced this type discomfort.  The discomfort is more noticeable when she is under stress or upset.  It is present today when the EKG was done.  Risk factor assessment includes negative tobacco use, nondiabetic, does have hyperlipidemia and hypertension.  No family history of coronary atherosclerosis.  Past Medical History:  Diagnosis Date   Anxiety    Arthritis    Depression    DM (diabetes mellitus) (HCC)    GERD (gastroesophageal reflux disease)    Hyperlipidemia    Hypertension    Insomnia    Menopausal and female climacteric states    Migraine    Obesity    Osteopenia    Tendon injury    left small finger   Wears contact lenses     Past Surgical History:  Procedure Laterality Date   CLOSED REDUCTION FINGER WITH PERCUTANEOUS PINNING Left 12/30/2014   Procedure: LEFT SMALL FINGER CLOSED REDUCTION AND PINNING;  Surgeon: Bradly Bienenstock, MD;  Location: [facility] [place];  Service: Orthopedics;  Laterality: Left;   ORIF RIGHT LONG FINGER FX AND TENOLYSIS  12-13-2009    Current Medications: Current Meds  Medication Sig    acetaminophen (TYLENOL) 500 MG tablet Take 650 mg by mouth as needed. Pt. Takes extra strength tylenol arthritis PRN   ALPRAZolam (XANAX) 0.25 MG tablet Take 0.25-0.5 mg by mouth every morning.   aspirin-acetaminophen-caffeine (EXCEDRIN MIGRAINE) 250-250-65 MG tablet Take by mouth every 6 (six) hours as needed for headache.   atenolol-chlorthalidone (TENORETIC) 50-25 MG tablet Take 0.5 tablets by mouth every evening.    gabapentin (NEURONTIN) 100 MG capsule Take 1 capsule (100 mg total) by mouth 3 (three) times daily.   lisinopril (ZESTRIL) 10 MG tablet Take 10 mg by mouth daily.   simvastatin (ZOCOR) 20 MG tablet Take 20 mg by mouth every evening.   zolpidem (AMBIEN) 10 MG tablet Take 10 mg by mouth at bedtime as needed for sleep. Pt take 0.5 mg at bedtime     Allergies:   Patient has no known allergies.   Social History   Socioeconomic History   Marital status: Married    Spouse name: Not on file   Number of children: Not on file   Years of education: Not on file   Highest education level: Not on file  Occupational History   Not on file  Tobacco Use   Smoking status: Never   Smokeless tobacco: Never  Substance and Sexual Activity   Alcohol use: No   Drug use: No   Sexual activity: Not on file  Other Topics Concern   Not on file  Social History Narrative   Not on file   Social Determinants of Health   Financial Resource Strain: Not on file  Food Insecurity: Not on file  Transportation Needs: Not on file  Physical Activity: Not on file  Stress: Not on file  Social Connections: Not on file     Family History: The patient's family history is not on file.  ROS:   Please see the history of present illness.    She does not have significant risk factors for CAD.  Her mother had atrial fibs and a pacemaker.  She wonders if she has broken heart syndrome.  All other systems reviewed and are negative.  EKGs/Labs/Other Studies Reviewed:    The following studies were reviewed  today: Most recent LDL is 76. Most recent hemoglobin A1c is 6.4. EKG:  EKG Normal sinus rhythm at 77 bpm with normal overall appearance.  Recent Labs: No results found for requested labs within last 8760 hours.  Recent Lipid Panel No results found for: CHOL, TRIG, HDL, CHOLHDL, VLDL, LDLCALC, LDLDIRECT  Physical Exam:    VS:  BP 124/78   Pulse 77   Ht 5' 1.5" (1.562 m)   Wt 176 lb (79.8 kg)   SpO2 98%   BMI 32.72 kg/m     Wt Readings from Last 3 Encounters:  01/09/21 176 lb (79.8 kg)  12/30/14 173 lb 8 oz (78.7 kg)     GEN: Overall, moderate obesity.. No acute distress HEENT: Normal NECK: No JVD. LYMPHATICS: No lymphadenopathy CARDIAC: No murmur. RRR no gallop, or edema. VASCULAR:  Normal Pulses. No bruits. RESPIRATORY:  Clear to auscultation without rales, wheezing or rhonchi  ABDOMEN: Soft, non-tender, non-distended, No pulsatile mass, MUSCULOSKELETAL: No deformity  SKIN: Warm and dry NEUROLOGIC:  Alert and oriented x 3 PSYCHIATRIC:  Normal affect   ASSESSMENT:    1. Chest pain of uncertain etiology   2. Primary hypertension   3. Hyperlipidemia LDL goal <70   4. Prediabetes    PLAN:    In order of problems listed above:  Uncertain etiology and low probability for CAD.  Plan exercise treadmill test.  Suspect her symptoms could be related to either musculoskeletal or gastroesophageal reflux. Blood pressure is well controlled on current therapy. Continue simvastatin 20 mg/day.  Most recent LDL 76. She disputes the concept that she has prediabetes.  Stress test will help [place] to rule out high-grade coronary disease which seems unlikely.   Medication Adjustments/Labs and Tests Ordered: Current medicines are reviewed at length with the patient today.  Concerns regarding medicines are outlined above.  Orders Placed This Encounter  Procedures   Exercise Tolerance Test    No orders of the defined types were placed in this encounter.   Patient Instructions   Medication Instructions:  Your physician recommends that you continue on your current medications as directed. Please refer to the Current Medication list given to you today.  *If you need a refill on your cardiac medications before your next appointment, please call your pharmacy*   Lab Work: None If you have labs (blood work) drawn today and your tests are completely normal, you will receive your results only by: MyChart Message (if you have MyChart) OR A paper copy in the mail If you have any lab test that is abnormal or we need to change your treatment, we will call you to review the results.   Testing/Procedures: Your physician has requested that you have  an exercise tolerance test. For further information please visit [email or web address] Please also follow instruction sheet, as given.   Follow-Up: At [facility], you and your health needs are our priority.  As part of our continuing mission to provide you with exceptional heart care, we have created designated Provider Care Teams.  These Care Teams include your primary Cardiologist (physician) and Advanced Practice Providers (APPs -  Physician Assistants and Nurse Practitioners) who all work together to provide you with the care you need, when you need it.  We recommend signing up for the patient portal called "MyChart".  Sign up information is provided on this After Visit Summary.  MyChart is used to connect with patients for Virtual Visits (Telemedicine).  Patients are able to view lab/test results, encounter notes, upcoming appointments, etc.  Non-urgent messages can be sent to your provider as well.   To learn more about what you can do with MyChart, go to [email or web address].    Your next appointment:   As needed  The format for your next appointment:   In Person  Provider:   You may see Verdis Prime, III, MD or one of the following Advanced Practice Providers on your designated Care Team:   Nada Boozer,  NP   Other Instructions     Signed, Lesleigh Noe, MD  01/09/2021 5:16 PM    [facility]

## 2021-01-09 ENCOUNTER — Ambulatory Visit (INDEPENDENT_AMBULATORY_CARE_PROVIDER_SITE_OTHER): Payer: BC Managed Care – PPO | Admitting: Interventional Cardiology

## 2021-01-09 ENCOUNTER — Other Ambulatory Visit: Payer: Self-pay

## 2021-01-09 ENCOUNTER — Encounter: Payer: Self-pay | Admitting: Interventional Cardiology

## 2021-01-09 VITALS — BP 124/78 | HR 77 | Ht 61.5 in | Wt 176.0 lb

## 2021-01-09 DIAGNOSIS — E1159 Type 2 diabetes mellitus with other circulatory complications: Secondary | ICD-10-CM

## 2021-01-09 DIAGNOSIS — E785 Hyperlipidemia, unspecified: Secondary | ICD-10-CM

## 2021-01-09 DIAGNOSIS — R7303 Prediabetes: Secondary | ICD-10-CM

## 2021-01-09 DIAGNOSIS — I10 Essential (primary) hypertension: Secondary | ICD-10-CM | POA: Diagnosis not present

## 2021-01-09 DIAGNOSIS — R079 Chest pain, unspecified: Secondary | ICD-10-CM

## 2021-01-09 NOTE — Patient Instructions (Signed)
Medication Instructions:  Your physician recommends that you continue on your current medications as directed. Please refer to the Current Medication list given to you today.  *If you need a refill on your cardiac medications before your next appointment, please call your pharmacy*   Lab Work: None If you have labs (blood work) drawn today and your tests are completely normal, you will receive your results only by: MyChart Message (if you have MyChart) OR A paper copy in the mail If you have any lab test that is abnormal or we need to change your treatment, we will call you to review the results.   Testing/Procedures: Your physician has requested that you have an exercise tolerance test. For further information please visit [email or web address] Please also follow instruction sheet, as given.   Follow-Up: At [facility], you and your health needs are our priority.  As part of our continuing mission to provide you with exceptional heart care, we have created designated Provider Care Teams.  These Care Teams include your primary Cardiologist (physician) and Advanced Practice Providers (APPs -  Physician Assistants and Nurse Practitioners) who all work together to provide you with the care you need, when you need it.  We recommend signing up for the patient portal called "MyChart".  Sign up information is provided on this After Visit Summary.  MyChart is used to connect with patients for Virtual Visits (Telemedicine).  Patients are able to view lab/test results, encounter notes, upcoming appointments, etc.  Non-urgent messages can be sent to your provider as well.   To learn more about what you can do with MyChart, go to [email or web address].    Your next appointment:   As needed  The format for your next appointment:   In Person  Provider:   You may see Verdis Prime, III, MD or one of the following Advanced Practice Providers on your designated Care Team:   Nada Boozer,  NP   Other Instructions

## 2021-01-12 ENCOUNTER — Ambulatory Visit (INDEPENDENT_AMBULATORY_CARE_PROVIDER_SITE_OTHER): Payer: BC Managed Care – PPO

## 2021-01-12 ENCOUNTER — Other Ambulatory Visit: Payer: Self-pay

## 2021-01-12 DIAGNOSIS — R079 Chest pain, unspecified: Secondary | ICD-10-CM | POA: Diagnosis not present

## 2021-01-12 LAB — EXERCISE TOLERANCE TEST
Angina Index: 0
Base ST Depression (mm): 0 mm
Duke Treadmill Score: 6
Estimated workload: 7.1
Exercise duration (min): 6 min
Exercise duration (sec): 7 s
MPHR: 158 {beats}/min
Peak HR: 137 {beats}/min
Percent HR: 86 %
RPE: 16
Rest HR: 77 {beats}/min
ST Depression (mm): 0 mm

## 2021-01-12 NOTE — Addendum Note (Signed)
Addended by: Durenda Hurt on: 01/12/2021 12:42 PM   Modules accepted: Orders

## 2021-01-18 ENCOUNTER — Telehealth: Payer: Self-pay | Admitting: Interventional Cardiology

## 2021-01-18 NOTE — Telephone Encounter (Signed)
Spoke with the patient who is wondering about her stress test results. Advised patient that Dr. Katrinka Blazing was off this week but that we would let her know as soon as he takes a look at them.

## 2021-01-18 NOTE — Telephone Encounter (Signed)
  Pt is calling to follow up her mychart message sent last night, also to follow up her stress test result

## 2021-01-20 NOTE — Telephone Encounter (Signed)
Spoke with the patient and notified her that her stress test was normal. Patient had no further questions.

## 2021-02-14 DIAGNOSIS — Z01419 Encounter for gynecological examination (general) (routine) without abnormal findings: Secondary | ICD-10-CM | POA: Diagnosis not present

## 2021-02-15 ENCOUNTER — Other Ambulatory Visit: Payer: Self-pay | Admitting: Obstetrics and Gynecology

## 2021-02-15 DIAGNOSIS — E2839 Other primary ovarian failure: Secondary | ICD-10-CM

## 2021-03-10 ENCOUNTER — Ambulatory Visit: Payer: BC Managed Care – PPO | Admitting: Podiatry

## 2021-03-29 ENCOUNTER — Telehealth: Payer: Self-pay | Admitting: Podiatry

## 2021-03-29 NOTE — Telephone Encounter (Signed)
Patient stated that she is still having severe burning sensation on her feet at night. She is unable to sleep. She would like to know can she increase the dosage please.  Please advise

## 2021-03-30 DIAGNOSIS — G629 Polyneuropathy, unspecified: Secondary | ICD-10-CM | POA: Diagnosis not present

## 2021-03-30 DIAGNOSIS — E782 Mixed hyperlipidemia: Secondary | ICD-10-CM | POA: Diagnosis not present

## 2021-03-30 DIAGNOSIS — F3342 Major depressive disorder, recurrent, in full remission: Secondary | ICD-10-CM | POA: Diagnosis not present

## 2021-03-30 DIAGNOSIS — E1169 Type 2 diabetes mellitus with other specified complication: Secondary | ICD-10-CM | POA: Diagnosis not present

## 2021-03-30 DIAGNOSIS — I10 Essential (primary) hypertension: Secondary | ICD-10-CM | POA: Diagnosis not present

## 2021-03-30 DIAGNOSIS — R1310 Dysphagia, unspecified: Secondary | ICD-10-CM | POA: Diagnosis not present

## 2021-03-30 MED ORDER — GABAPENTIN 300 MG PO CAPS: 300.0000 mg | ORAL_CAPSULE | Freq: Three times a day (TID) | ORAL | 3 refills | Status: DC

## 2021-03-30 NOTE — Addendum Note (Signed)
Addended by: Nicholes Rough on: 03/30/2021 08:40 AM   Modules accepted: Orders

## 2021-04-09 ENCOUNTER — Other Ambulatory Visit: Payer: Self-pay | Admitting: Podiatry

## 2021-04-10 NOTE — Telephone Encounter (Signed)
Please advise 

## 2021-05-01 ENCOUNTER — Other Ambulatory Visit: Payer: Self-pay | Admitting: Family Medicine

## 2021-05-01 DIAGNOSIS — R1310 Dysphagia, unspecified: Secondary | ICD-10-CM

## 2021-05-04 ENCOUNTER — Ambulatory Visit (INDEPENDENT_AMBULATORY_CARE_PROVIDER_SITE_OTHER): Payer: BC Managed Care – PPO | Admitting: Orthopedic Surgery

## 2021-05-04 ENCOUNTER — Other Ambulatory Visit: Payer: Self-pay

## 2021-05-04 DIAGNOSIS — M722 Plantar fascial fibromatosis: Secondary | ICD-10-CM

## 2021-05-05 ENCOUNTER — Encounter: Payer: Self-pay | Admitting: Orthopedic Surgery

## 2021-05-05 ENCOUNTER — Telehealth: Payer: Self-pay | Admitting: Orthopedic Surgery

## 2021-05-05 ENCOUNTER — Ambulatory Visit
Admission: RE | Admit: 2021-05-05 | Discharge: 2021-05-05 | Disposition: A | Discharge status: Home / Self Care | Payer: BC Managed Care – PPO | Source: Ambulatory Visit | Attending: Family Medicine | Admitting: Family Medicine

## 2021-05-05 DIAGNOSIS — R1310 Dysphagia, unspecified: Secondary | ICD-10-CM | POA: Diagnosis not present

## 2021-05-05 DIAGNOSIS — K224 Dyskinesia of esophagus: Secondary | ICD-10-CM | POA: Diagnosis not present

## 2021-05-05 NOTE — Telephone Encounter (Signed)
You saw this pt in the office yesterday. Please see the message below and advise.

## 2021-05-05 NOTE — Progress Notes (Signed)
Office Visit Note   Patient: Tiffany Travis           Date of Birth: [birth year]           MRN: [id number] Visit Date: 05/04/2021              Requested by: Lupita Raider, MD [address] [place],  [place] [address] PCP: Lupita Raider, MD  Chief Complaint  Patient presents with   Right Foot - Pain   Left Foot - Pain      HPI: Patient is a 61 year old woman who is seen for initial evaluation for chronic bilateral Planter fasciitis.  Patient has used inserts as well as gabapentin 300 mg 3 times a day she has tried Voltaren gel without relief.  Assessment & Plan: Visit Diagnoses:  1. Plantar fasciitis, bilateral     Plan: Patient has significant Achilles contracture she was given instructions and demonstrated Achilles stretching recommended weaning off the Neurontin and reevaluate in 4 weeks.  Discussed that she may require gastrocnemius recession if she is unable to restore the length of the Achilles.  Follow-Up Instructions: Return in about 4 weeks (around 06/01/2021).   Ortho Exam  Patient is alert, oriented, no adenopathy, well-dressed, normal affect, normal respiratory effort. Examination patient has cavovarus feet bilaterally with a high arch.  She is pain to palpation along the plantar fascia.  Lateral compression of the calcaneus is nontender there is no palpable Achilles defects.  With her knee extended she has significant Achilles contracture with dorsiflexion 20 degrees short of neutral bilaterally.  Imaging: DG ESOPHAGUS W DOUBLE CM (HD)  Result Date: 05/05/2021 CLINICAL DATA:  61 year old female with history of small hiatal hernia presents with complaint of dysphagia described as solid foods getting stuck. EXAM: ESOPHAGUS/BARIUM SWALLOW/TABLET STUDY TECHNIQUE: Combined double and single contrast examination was performed using effervescent crystals, high-density barium, and thin liquid barium. This exam was performed by Loyce Dys PA-C, and was  supervised and interpreted by Cleone Slim, MD. FLUOROSCOPY TIME:  Radiation Exposure Index (as provided by the fluoroscopic device): 34.831 mGy COMPARISON:  RF DG ESOPHAGUS 02/27/2016 FINDINGS: Swallowing: Appears normal. No vestibular penetration or aspiration seen. Pharynx: Normal in appear.  No mass, stricture, or ulcers. Esophagus: Persistent short segment of smooth narrowing at the esophagogastric junction, at which location the barium tablet became lodged despite multiple water and barium swallows, suggesting a mild peptic stricture in this location. No evidence of esophageal mass or ulcer. Esophageal motility: Mild esophageal dysmotility characterized predominantly by intermittent mild weakening of primary peristalsis throughout the thoracic esophagus. Hiatal Hernia: Small hiatal hernia. Gastroesophageal reflux: None elicited despite provocative maneuvers including water siphon test. Ingested 31mm barium tablet: Persistently lodged at the esophagogastric junction. Other: None. IMPRESSION: 1. Small hiatal hernia.  No gastroesophageal reflux elicited. 2. 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 Upper endoscopic correlation suggested. No discrete esophageal mass. 3. Mild esophageal dysmotility, with a chronic reflux related dysmotility pattern. Electronically Signed   By: Delbert Phenix M.D.   On: 05/05/2021 12:32   No images are attached to the encounter.  Labs: No results found for: HGBA1C, ESRSEDRATE, CRP, LABURIC, REPTSTATUS, GRAMSTAIN, CULT, LABORGA   No results found for: ALBUMIN, PREALBUMIN, CBC  No results found for: MG No results found for: VD25OH  No results found for: PREALBUMIN CBC EXTENDED Latest Ref Rng & Units 12/30/2014 12/13/2009  HGB 12.0 - 15.0 g/dL 76.2 83.1  HCT 51.7 -  46.0 % 42.0 -     There is no height or weight on file to calculate BMI.  Orders:  No orders of the defined types were placed in this encounter.  No orders  of the defined types were placed in this encounter.    Procedures: No procedures performed  Clinical Data: No additional findings.  ROS:  All other systems negative, except as noted in the HPI. Review of Systems  Objective: Vital Signs: There were no vitals taken for this visit.  Specialty Comments:  No specialty comments available.  PMFS History: Patient Active Problem List   Diagnosis Date Noted   OSTEOARTHRITIS, HIP, RIGHT 01/24/2010   TROCHANTERIC BURSITIS, RIGHT 01/24/2010   HIP STRAIN, RIGHT 01/24/2010   Past Medical History:  Diagnosis Date   Anxiety    Arthritis    Depression    DM (diabetes mellitus) (HCC)    GERD (gastroesophageal reflux disease)    Hyperlipidemia    Hypertension    Insomnia    Menopausal and female climacteric states    Migraine    Obesity    Osteopenia    Tendon injury    left small finger   Wears contact lenses     No family history on file.  Past Surgical History:  Procedure Laterality Date   CLOSED REDUCTION FINGER WITH PERCUTANEOUS PINNING Left 12/30/2014   Procedure: LEFT SMALL FINGER CLOSED REDUCTION AND PINNING;  Surgeon: Bradly Bienenstock, MD;  Location: [facility];  Service: Orthopedics;  Laterality: Left;   ORIF RIGHT LONG FINGER FX AND TENOLYSIS  12-13-2009   Social History   Occupational History   Not on file  Tobacco Use   Smoking status: Never   Smokeless tobacco: Never  Substance and Sexual Activity   Alcohol use: No   Drug use: No   Sexual activity: Not on file

## 2021-05-05 NOTE — Telephone Encounter (Signed)
Patient called. She has some questions about how to stop her gabapentin. Her call back number is [phone]

## 2021-05-08 NOTE — Telephone Encounter (Signed)
Pt informed

## 2021-05-16 DIAGNOSIS — D2372 Other benign neoplasm of skin of left lower limb, including hip: Secondary | ICD-10-CM | POA: Diagnosis not present

## 2021-05-16 DIAGNOSIS — L821 Other seborrheic keratosis: Secondary | ICD-10-CM | POA: Diagnosis not present

## 2021-05-16 DIAGNOSIS — D2362 Other benign neoplasm of skin of left upper limb, including shoulder: Secondary | ICD-10-CM | POA: Diagnosis not present

## 2021-05-16 DIAGNOSIS — L814 Other melanin hyperpigmentation: Secondary | ICD-10-CM | POA: Diagnosis not present

## 2021-05-30 ENCOUNTER — Ambulatory Visit: Payer: BC Managed Care – PPO | Admitting: Orthopedic Surgery

## 2021-06-07 DIAGNOSIS — K2970 Gastritis, unspecified, without bleeding: Secondary | ICD-10-CM | POA: Diagnosis not present

## 2021-06-07 DIAGNOSIS — R1310 Dysphagia, unspecified: Secondary | ICD-10-CM | POA: Diagnosis not present

## 2021-06-07 DIAGNOSIS — K5730 Diverticulosis of large intestine without perforation or abscess without bleeding: Secondary | ICD-10-CM | POA: Diagnosis not present

## 2021-06-07 DIAGNOSIS — K219 Gastro-esophageal reflux disease without esophagitis: Secondary | ICD-10-CM | POA: Diagnosis not present

## 2021-06-07 DIAGNOSIS — K2930 Chronic superficial gastritis without bleeding: Secondary | ICD-10-CM | POA: Diagnosis not present

## 2021-06-07 DIAGNOSIS — K648 Other hemorrhoids: Secondary | ICD-10-CM | POA: Diagnosis not present

## 2021-06-07 DIAGNOSIS — K222 Esophageal obstruction: Secondary | ICD-10-CM | POA: Diagnosis not present

## 2021-06-07 DIAGNOSIS — Z1211 Encounter for screening for malignant neoplasm of colon: Secondary | ICD-10-CM | POA: Diagnosis not present

## 2021-07-18 ENCOUNTER — Other Ambulatory Visit: Payer: BC Managed Care – PPO

## 2022-06-06 IMAGING — RF DG ESOPHAGUS
14 of 20 series · 14 of 24 positions shown · non-contrast
Comparison: RF DG ESOPHAGUS 02/27/2016

CLINICAL DATA: 60 year old female with history of small hiatal
hernia presents with complaint of dysphagia described as solid foods
getting stuck.

EXAM:
ESOPHAGUS/BARIUM SWALLOW/TABLET STUDY
TECHNIQUE: Combined double and single contrast examination was performed using
effervescent crystals, high-density barium, and thin liquid barium.
This exam was performed by Pozisa Tiger, and was supervised
and interpreted by Vancini, Xinglong.
FLUOROSCOPY TIME:  Radiation Exposure Index (as provided by the
fluoroscopic device): 34.831 mGy

[Series 1: sequence · 1 of 16 frames shown (1 of 14)]
[frame 3/16]
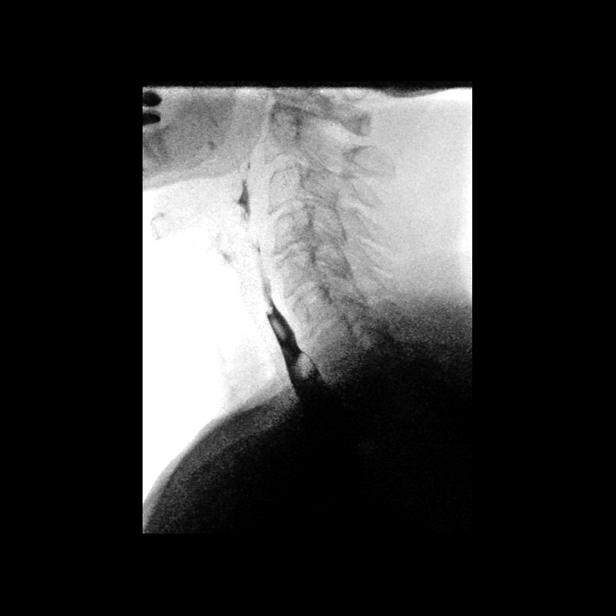

[Series 2: sequence · 1 of 22 frames shown (2 of 14)]
[frame 12/22]
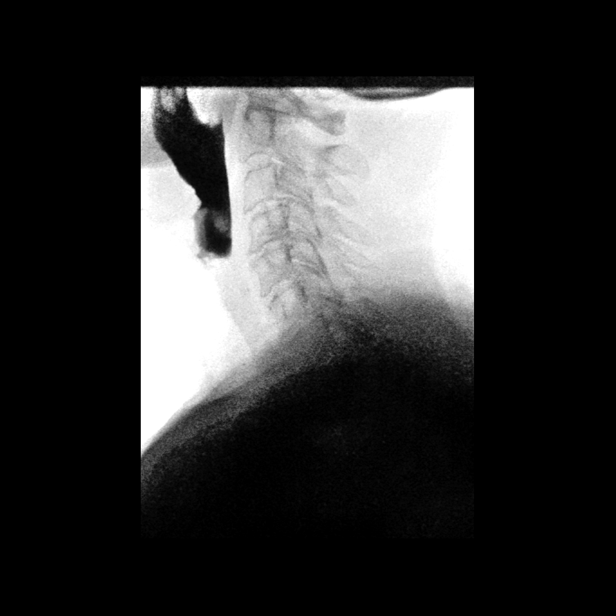

[Series 4: sequence · 1 of 34 frames shown (3 of 14)]
[frame 6/34]
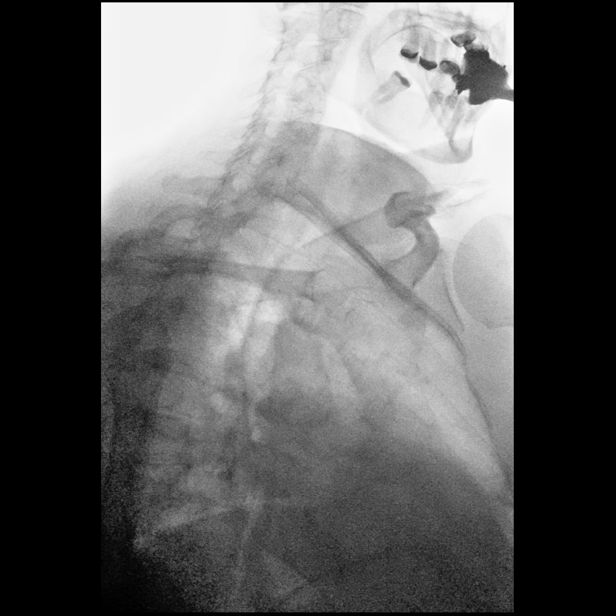

[Series 6: sequence · 0.28mm/px · 1 of 2 frames shown (4 of 14)]
[frame 2/2]
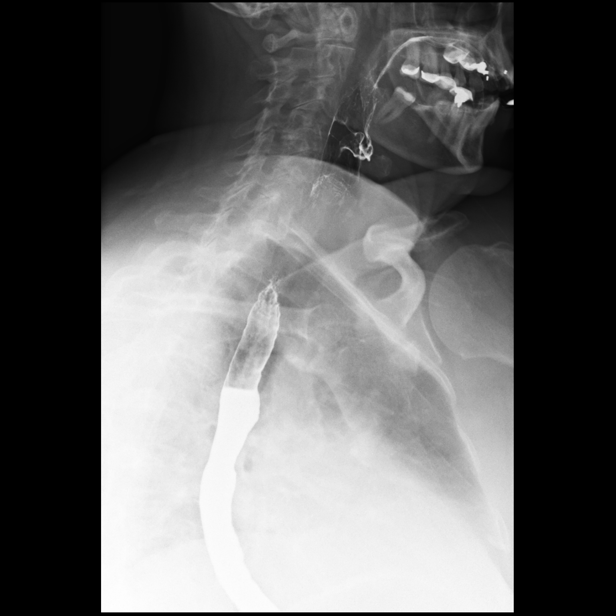

[Series 8: sequence · 0.28mm/px · 1 of 2 frames shown (5 of 14)]
[frame 2/2]
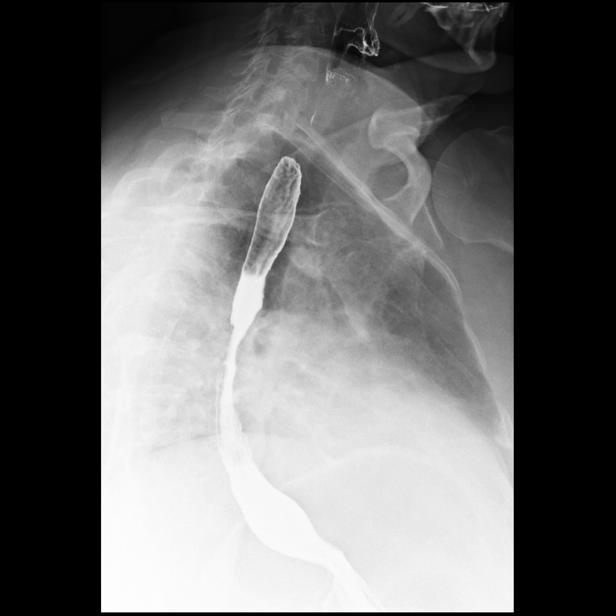

[Series 10: sequence · 1 of 59 frames shown (6 of 14)]
[frame 9/59]
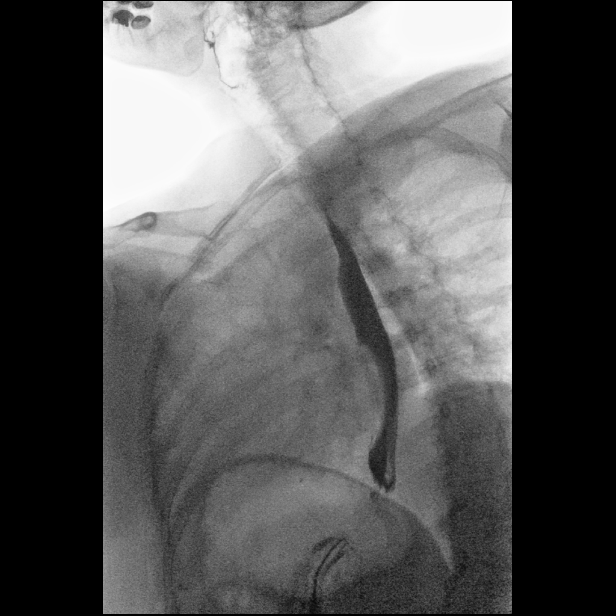

[Series 11: sequence · 1 of 46 frames shown (7 of 14)]
[frame 40/46]
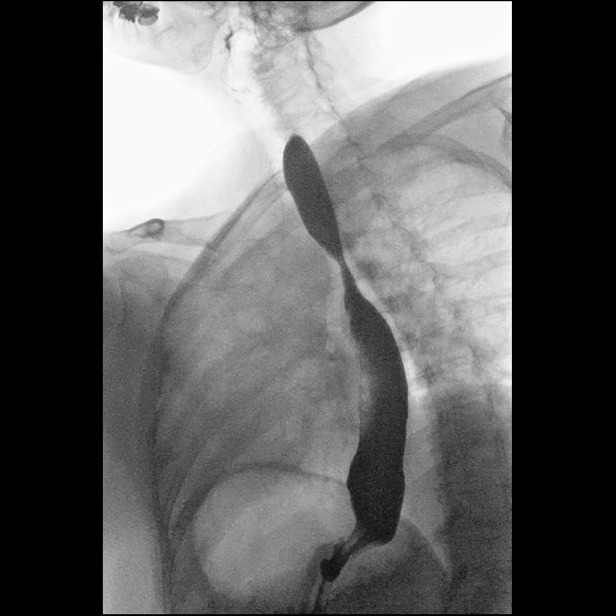

[Series 12: sequence · 1 of 61 frames shown (8 of 14)]
[frame 52/61]
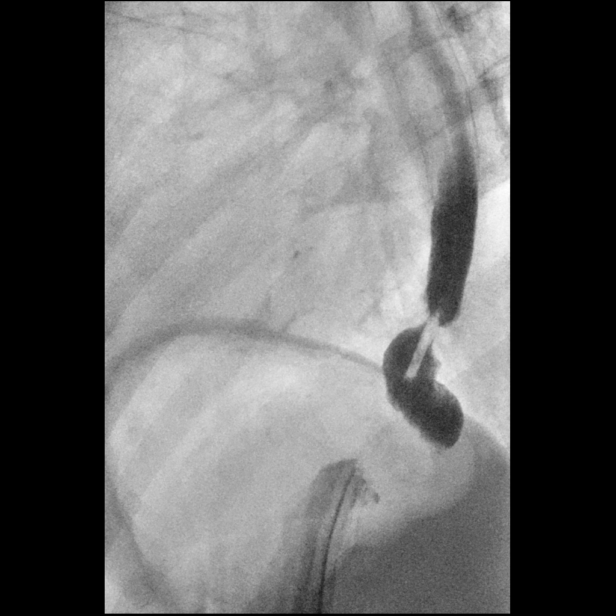

[Series 14: sequence · 1 of 13 frames shown (9 of 14)]
[frame 7/13]
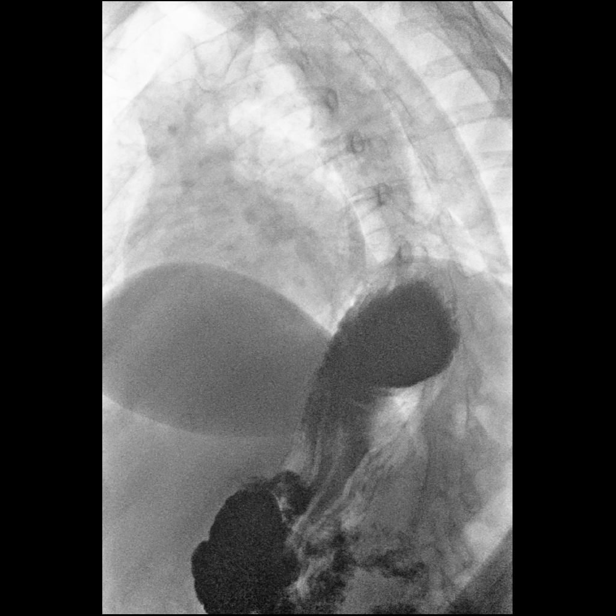

[Series 16: sequence · 1 of 26 frames shown (10 of 14)]
[frame 4/26]
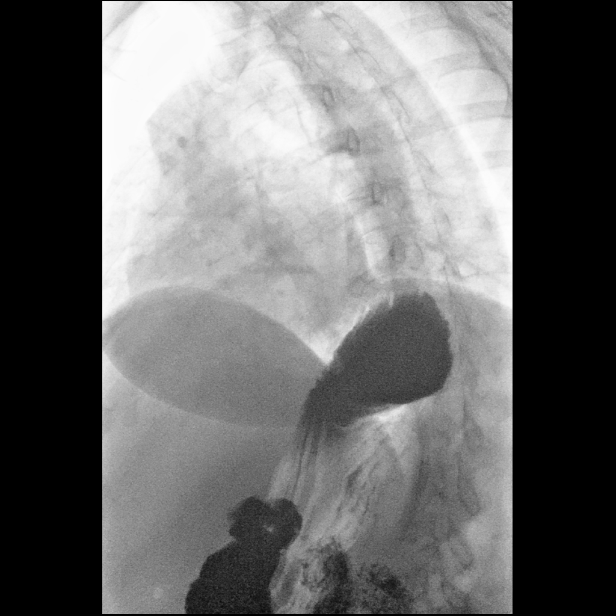

[Series 17: sequence · 1 of 30 frames shown (11 of 14)]
[frame 26/30]
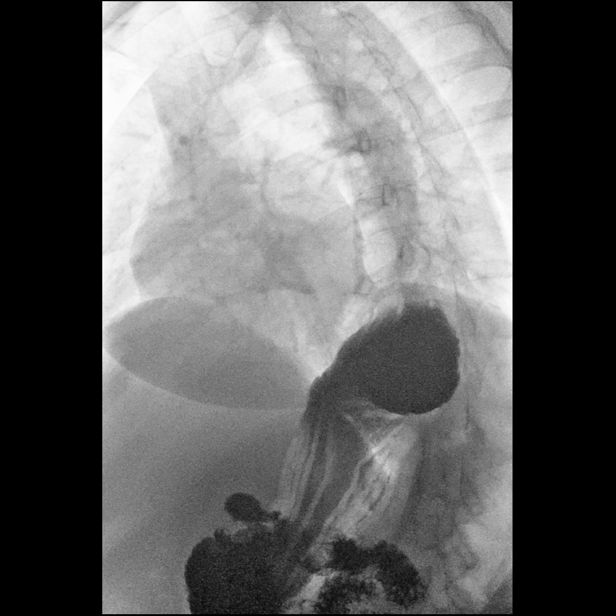

[Series 18: sequence · 1 of 45 frames shown (12 of 14)]
[frame 32/45]
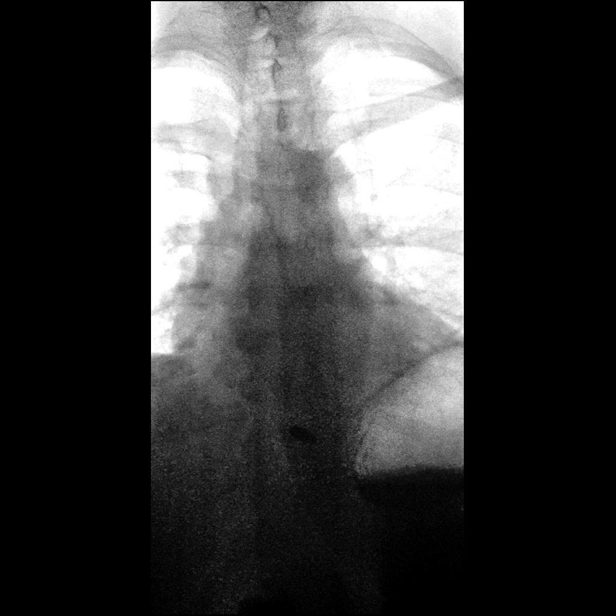

[Series 20: sequence · 1 of 37 frames shown (13 of 14)]
[frame 19/37]
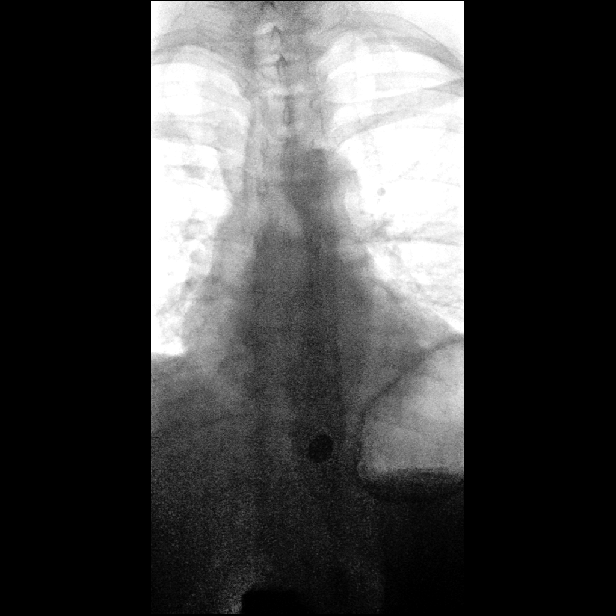

[Series 21: sequence · 1 of 25 frames shown (14 of 14)]
[frame 22/25]
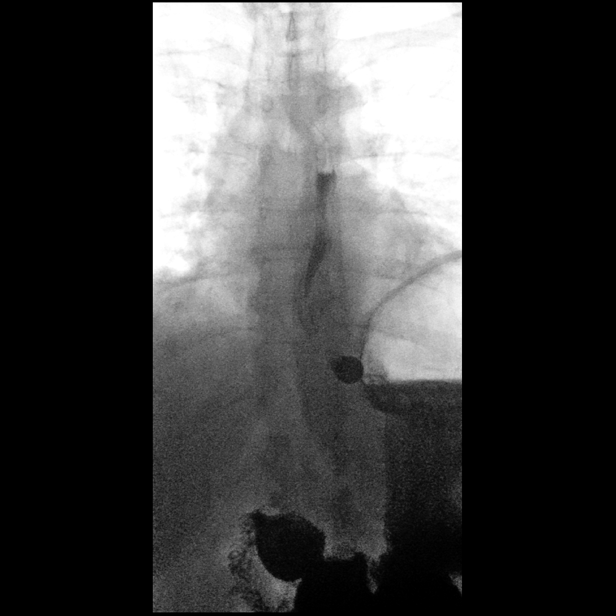

[14 of 24 positions shown; findings below may reference images not displayed]

FINDINGS: Swallowing: Appears normal. No vestibular penetration or aspiration
seen.

Pharynx: Normal in appear.  No mass, stricture, or ulcers.

Esophagus: Persistent short segment of smooth narrowing at the
esophagogastric junction, at which location the barium tablet became
lodged despite multiple water and barium swallows, suggesting a mild
peptic stricture in this location. No evidence of esophageal mass or
ulcer.

Esophageal motility: Mild esophageal dysmotility characterized
predominantly by intermittent mild weakening of primary peristalsis
throughout the thoracic esophagus.

Hiatal Hernia: Small hiatal hernia.

Gastroesophageal reflux: None elicited despite provocative maneuvers
including water siphon test.

Ingested 13mm barium tablet: Persistently lodged at the
esophagogastric junction.

Other: None.
IMPRESSION: 1. Small hiatal hernia.  No gastroesophageal reflux elicited.
2. Findings suggestive of a mild peptic stricture at the
esophagogastric junction, see comments. Upper endoscopic correlation
suggested. No discrete esophageal mass.
3. Mild esophageal dysmotility, with a chronic reflux related
dysmotility pattern.

## 2022-09-04 ENCOUNTER — Ambulatory Visit: Payer: Commercial Managed Care - HMO | Admitting: Podiatry

## 2022-09-04 DIAGNOSIS — E1142 Type 2 diabetes mellitus with diabetic polyneuropathy: Secondary | ICD-10-CM

## 2022-09-04 DIAGNOSIS — G6289 Other specified polyneuropathies: Secondary | ICD-10-CM | POA: Diagnosis not present

## 2022-09-04 MED ORDER — GABAPENTIN 300 MG PO CAPS: 300.0000 mg | ORAL_CAPSULE | Freq: Two times a day (BID) | ORAL | 0 refills | Status: DC

## 2022-09-04 NOTE — Progress Notes (Signed)
Chief Complaint  Patient presents with   Peripheral Neuropathy    Rm 11 Bilateral nerve burning. Pt states had neuropathy and having a lot of burning sensation that are increased at night. Pt states she is not being treated with medication but thinks it is time to try a medication for neuropathy to minimize or resolve the symptoms.     HPI: 62 y.o. female presenting today with concern of burning pain in the bilateral plantar forefoot.  She states that this is progressively worsening over the past 6 months.  She states that she does not have any discomfort or numbness to the top of the foot or the heel area or the lower legs.  Denies history of sciatica.  Denies lower back pain or discomfort.  When reviewing the patient's current medication list that is in the epic system, it was noted that she takes gabapentin, Zoloft, Xanax, Ambien.  When discussing this with the patient today.  She stated that she takes none of these medications at this time.  She does not recall ever taking gabapentin.  States that this has been ongoing for over 6 months and has become chronic.  States that it is not constant every day.  It is not dependent upon whether she is sitting standing or laying.  It is not worse at bedtime.  It does not wake her up when sleeping.  Denies feeling anything similar to a pebble stuck in her shoe.  States that the pain is not radiating  Past Medical History:  Diagnosis Date   Anxiety    Arthritis    Depression    DM (diabetes mellitus) (HCC)    GERD (gastroesophageal reflux disease)    Hyperlipidemia    Hypertension    Insomnia    Menopausal and female climacteric states    Migraine    Obesity    Osteopenia    Tendon injury    left small finger   Wears contact lenses     Past Surgical History:  Procedure Laterality Date   CLOSED REDUCTION FINGER WITH PERCUTANEOUS PINNING Left 12/30/2014   Procedure: LEFT SMALL FINGER CLOSED REDUCTION AND PINNING;  Surgeon: Bradly Bienenstock,  MD;  Location: [facility] [place];  Service: Orthopedics;  Laterality: Left;   ORIF RIGHT LONG FINGER FX AND TENOLYSIS  12-13-2009    No Known Allergies    Physical Exam:  General: The patient is alert and oriented x3 in no acute distress.  Dermatology: Skin is warm, dry and supple bilateral lower extremities. Interspaces are clear of maceration and debris.    Vascular: Palpable pedal pulses bilaterally. Capillary refill within normal limits.  No appreciable edema.  No erythema or calor.  Neurological: Light touch sensation grossly intact bilateral feet.  Negative Tinel's sign with percussion of the posterior tibial nerve bilateral.  Manual muscle testing is 5/5 bilateral.  Protective sensation is intact to the forefoot with a Semmes Weinstein monofilament.  Vibration sensation is intact.  Musculoskeletal Exam: Ankle dorsiflexion is less than 10 degrees with the knee extended due to tight gastrosoleus complex bilateral.  No significant foot deformity is noted.  Assessment/Plan of Care: 1. Other polyneuropathy   2. Type 2 diabetes mellitus with diabetic polyneuropathy, without long-term current use of insulin (HCC)     Meds ordered this encounter  Medications   DISCONTD: gabapentin (NEURONTIN) 300 MG capsule    Sig: Take 1 capsule (300 mg total) by mouth 2 (two) times daily.  Dispense:  60 capsule    Refill:  0   gabapentin (NEURONTIN) 300 MG capsule    Sig: Take 1 capsule (300 mg total) by mouth 2 (two) times daily.    Dispense:  60 capsule    Refill:  0   Her previous notes in the epic system were reviewed today.  It is noted 2 years ago that she was diabetic as noted by Dr. Michaelle Copas note from 01/19/2021.  However patient does not actively take any medication for this and did not state that she was diet controlled today.  Her HbA1c according to his note was 6.4 at that time.  Prior history of hyperglycemia or previous diabetes diagnosis, could be related to her current  symptoms.  However, if the medication being prescribed today does not provide improvement I recommend nerve conduction study just to be thorough and rule out other possible causes or possible nerve entrapment.  Discussed clinical findings with patient today.  Patient was amenable to beginning the gabapentin today.  Prescription written for 300 mg to take twice daily.  She advised on how to take this with regard to her other medications.  She was advised on the risks of taking this medication and possible risks of drug interactions.  She needs to let her family physician know what she is taking at this time so as to avoid any medication interactions.  She would benefit from possible physical therapy with e-stim/TENS if the medication does not help as well.  This will improve her ankle range of motion (dorsiflexion) and can also help with the localized burning pain.  Will address this at her next visit.    Informed the patient that the medication will be for 30 days and she needs to call prior to cessation of the medication to provide a progress report on how well she is improving or not.  May need to adjust medication or change medication altogether based on her symptoms.  Today's visit was for a condition that is progressively worsening, which involved the review of prior test results, ordering of new medications and discussion of new test to be ordered, discussing moderate risk involved with the medication.  All questions were answered today.   Clerance Lav, DPM, FACFAS [facility]     [address] [place], [place] [address]                Office [phone]  Fax [phone]

## 2022-10-09 ENCOUNTER — Telehealth: Payer: Self-pay | Admitting: Podiatry

## 2022-10-09 DIAGNOSIS — E1142 Type 2 diabetes mellitus with diabetic polyneuropathy: Secondary | ICD-10-CM

## 2022-10-09 MED ORDER — PREGABALIN 75 MG PO CAPS: 75.0000 mg | ORAL_CAPSULE | Freq: Two times a day (BID) | ORAL | 0 refills | Status: DC

## 2022-10-09 NOTE — Telephone Encounter (Signed)
Patient noted gabapentin isn't working.  Will send in Lyrica 75mg  BID x 30 days.  Patient to call in one month for progress report.

## 2022-10-09 NOTE — Addendum Note (Signed)
Addended byLucia Estelle D on: 10/09/2022 07:58 PM   Modules accepted: Orders

## 2022-10-09 NOTE — Telephone Encounter (Signed)
Pt says that the Gabapentin is not working and DR.Dia said she would send in a different medication. The pharmacy that it should sent to is, [facility] [address] [phone]

## 2022-11-12 ENCOUNTER — Other Ambulatory Visit: Payer: Self-pay | Admitting: Podiatry

## 2022-11-12 DIAGNOSIS — E1142 Type 2 diabetes mellitus with diabetic polyneuropathy: Secondary | ICD-10-CM

## 2022-12-27 ENCOUNTER — Other Ambulatory Visit: Payer: Self-pay | Admitting: Podiatry

## 2022-12-27 DIAGNOSIS — E1142 Type 2 diabetes mellitus with diabetic polyneuropathy: Secondary | ICD-10-CM

## 2023-02-02 ENCOUNTER — Other Ambulatory Visit: Payer: Self-pay | Admitting: Podiatry

## 2023-02-02 DIAGNOSIS — E1142 Type 2 diabetes mellitus with diabetic polyneuropathy: Secondary | ICD-10-CM

## 2023-02-05 ENCOUNTER — Telehealth: Payer: Self-pay | Admitting: Podiatry

## 2023-02-05 NOTE — Telephone Encounter (Signed)
Received a fax in my mailbox folder requesting renewal for the Lyrica for this patient.  Upon reviewing her record, it appears that Dr. Annamary Rummage went ahead and sent this in on 02/02/23 for the patient.

## 2023-03-09 ENCOUNTER — Other Ambulatory Visit: Payer: Self-pay | Admitting: Podiatry

## 2023-03-09 DIAGNOSIS — E1142 Type 2 diabetes mellitus with diabetic polyneuropathy: Secondary | ICD-10-CM

## 2023-04-22 ENCOUNTER — Other Ambulatory Visit: Payer: Self-pay | Admitting: Podiatry

## 2023-04-22 DIAGNOSIS — E1142 Type 2 diabetes mellitus with diabetic polyneuropathy: Secondary | ICD-10-CM

## 2023-05-09 ENCOUNTER — Other Ambulatory Visit: Payer: Self-pay | Admitting: Family Medicine

## 2023-05-09 ENCOUNTER — Encounter: Payer: Self-pay | Admitting: Family Medicine

## 2023-05-09 DIAGNOSIS — R109 Unspecified abdominal pain: Secondary | ICD-10-CM

## 2023-05-14 ENCOUNTER — Ambulatory Visit
Admission: RE | Admit: 2023-05-14 | Discharge: 2023-05-14 | Disposition: A | Discharge status: Home / Self Care | Payer: Commercial Managed Care - HMO | Source: Ambulatory Visit | Attending: Family Medicine | Admitting: Family Medicine

## 2023-05-14 DIAGNOSIS — R109 Unspecified abdominal pain: Secondary | ICD-10-CM

## 2023-05-14 MED ORDER — IOPAMIDOL (ISOVUE-300) INJECTION 61%
100.0000 mL | Freq: Once | INTRAVENOUS | Status: AC | PRN
  Administered 2023-05-14: 100 mL via INTRAVENOUS

## 2023-06-03 ENCOUNTER — Other Ambulatory Visit: Payer: Self-pay | Admitting: Podiatry

## 2023-06-03 DIAGNOSIS — E1142 Type 2 diabetes mellitus with diabetic polyneuropathy: Secondary | ICD-10-CM

## 2023-07-10 ENCOUNTER — Other Ambulatory Visit: Payer: Self-pay | Admitting: Podiatry

## 2023-07-10 DIAGNOSIS — E1142 Type 2 diabetes mellitus with diabetic polyneuropathy: Secondary | ICD-10-CM

## 2023-07-12 ENCOUNTER — Other Ambulatory Visit (HOSPITAL_COMMUNITY): Payer: Self-pay | Admitting: Physician Assistant

## 2023-07-12 ENCOUNTER — Ambulatory Visit (HOSPITAL_COMMUNITY)
Admission: RE | Admit: 2023-07-12 | Discharge: 2023-07-12 | Disposition: A | Discharge status: Home / Self Care | Source: Ambulatory Visit | Attending: Cardiovascular Disease | Admitting: Cardiovascular Disease

## 2023-07-12 DIAGNOSIS — M79604 Pain in right leg: Secondary | ICD-10-CM | POA: Insufficient documentation

## 2023-08-20 ENCOUNTER — Other Ambulatory Visit: Payer: Self-pay | Admitting: Podiatry

## 2023-08-20 DIAGNOSIS — E1142 Type 2 diabetes mellitus with diabetic polyneuropathy: Secondary | ICD-10-CM

## 2023-08-23 ENCOUNTER — Other Ambulatory Visit: Payer: Self-pay | Admitting: Student

## 2023-08-23 DIAGNOSIS — R109 Unspecified abdominal pain: Secondary | ICD-10-CM

## 2023-08-28 ENCOUNTER — Ambulatory Visit
Admission: RE | Admit: 2023-08-28 | Discharge: 2023-08-28 | Disposition: A | Discharge status: Home / Self Care | Source: Ambulatory Visit | Attending: Student | Admitting: Student

## 2023-08-28 DIAGNOSIS — R109 Unspecified abdominal pain: Secondary | ICD-10-CM

## 2023-09-16 ENCOUNTER — Telehealth: Payer: Self-pay | Admitting: Podiatry

## 2023-09-16 NOTE — Telephone Encounter (Signed)
 Patient is requesting for Dr. McCaughan to provide referral for pain clinic on [address]. Patient contact telephone number, [phone]

## 2023-10-08 ENCOUNTER — Other Ambulatory Visit: Payer: Self-pay | Admitting: Podiatry

## 2023-10-08 DIAGNOSIS — E1142 Type 2 diabetes mellitus with diabetic polyneuropathy: Secondary | ICD-10-CM

## 2023-10-14 ENCOUNTER — Ambulatory Visit: Admitting: Podiatry

## 2023-10-14 DIAGNOSIS — E1142 Type 2 diabetes mellitus with diabetic polyneuropathy: Secondary | ICD-10-CM

## 2023-10-14 DIAGNOSIS — G6289 Other specified polyneuropathies: Secondary | ICD-10-CM | POA: Diagnosis not present

## 2023-10-14 NOTE — Progress Notes (Signed)
.     Chief Complaint  Patient presents with   Polyneuropathy    Pre-diabetic. No anticoag. Taking Lyrica  75 mg bid. Not helping with pain, numbness and throbbing.    HPI: 63 y.o. female presents today for discussion of referral to pain clinic.  This is for her neuropathy that is present mostly from the midfoot to the toes bilateral.  It is equal and how it affects both feet in severity.  Notes that when she is at rest is when the neuropathy symptoms are worse.  She often has to continue moving her feet to get relief.  States that this does affect her ability to sleep and she has not been sleeping well.  Previously has been on gabapentin  and Lyrica  with minimal to no improvement.  Past Medical History:  Diagnosis Date   Anxiety    Arthritis    Depression    DM (diabetes mellitus) (HCC)    GERD (gastroesophageal reflux disease)    Hyperlipidemia    Hypertension    Insomnia    Menopausal and female climacteric states    Migraine    Obesity    Osteopenia    Tendon injury    left small finger   Wears contact lenses    Past Surgical History:  Procedure Laterality Date   CLOSED REDUCTION FINGER WITH PERCUTANEOUS PINNING Left 12/30/2014   Procedure: LEFT SMALL FINGER CLOSED REDUCTION AND PINNING;  Surgeon: Prentice Pagan, MD;  Location: [facility] [place];  Service: Orthopedics;  Laterality: Left;   ORIF RIGHT LONG FINGER FX AND TENOLYSIS  12-13-2009   No Known Allergies   Physical Exam: On exam there are palpable pedal pulses.  Interspaces are clear of maceration and debris.  No mottling of the skin.  There is decreased vibratory sensation to the first MPJ bilateral.  Light touch sensation is intact and protective sensation is intact with Semmes Weinstein monofilament.  Manual muscle testing 5/5  Assessment/Plan of Care: 1. Type 2 diabetes mellitus with diabetic polyneuropathy, without long-term current use of insulin (HCC)   2. Other polyneuropathy     AMB REFERRAL TO PAIN  CLINIC  Discussed findings with the patient today.  Will go ahead and make the referral to the pain management clinic.  Will see if we can help with her neuropathy symptoms so that she has a better quality of life and is able to get better sleep at night.  Follow-up as needed   Awanda CHARM Imperial, DPM, FACFAS [facility]     [address] [place], [place] [address]                Office [phone]  Fax [phone]

## 2023-11-04 ENCOUNTER — Other Ambulatory Visit: Payer: Self-pay

## 2023-11-04 ENCOUNTER — Encounter (HOSPITAL_COMMUNITY)
Admission: RE | Admit: 2023-11-04 | Discharge: 2023-11-04 | Disposition: A | Discharge status: Home / Self Care | Source: Ambulatory Visit | Attending: Orthopedic Surgery | Admitting: Orthopedic Surgery

## 2023-11-04 ENCOUNTER — Encounter (HOSPITAL_COMMUNITY): Payer: Self-pay

## 2023-11-04 VITALS — BP 138/66 | HR 68 | Temp 98.4°F | Resp 20 | Ht 61.5 in | Wt 175.0 lb

## 2023-11-04 DIAGNOSIS — M01X61 Direct infection of right knee in infectious and parasitic diseases classified elsewhere: Secondary | ICD-10-CM | POA: Insufficient documentation

## 2023-11-04 DIAGNOSIS — K219 Gastro-esophageal reflux disease without esophagitis: Secondary | ICD-10-CM | POA: Insufficient documentation

## 2023-11-04 DIAGNOSIS — F419 Anxiety disorder, unspecified: Secondary | ICD-10-CM | POA: Diagnosis not present

## 2023-11-04 DIAGNOSIS — G43909 Migraine, unspecified, not intractable, without status migrainosus: Secondary | ICD-10-CM | POA: Diagnosis not present

## 2023-11-04 DIAGNOSIS — Z79891 Long term (current) use of opiate analgesic: Secondary | ICD-10-CM | POA: Insufficient documentation

## 2023-11-04 DIAGNOSIS — E119 Type 2 diabetes mellitus without complications: Secondary | ICD-10-CM | POA: Insufficient documentation

## 2023-11-04 DIAGNOSIS — I10 Essential (primary) hypertension: Secondary | ICD-10-CM | POA: Insufficient documentation

## 2023-11-04 DIAGNOSIS — Z01818 Encounter for other preprocedural examination: Secondary | ICD-10-CM | POA: Insufficient documentation

## 2023-11-04 DIAGNOSIS — F329 Major depressive disorder, single episode, unspecified: Secondary | ICD-10-CM | POA: Insufficient documentation

## 2023-11-04 LAB — COMPREHENSIVE METABOLIC PANEL WITH GFR
ALT: 23 U/L (ref 0–44)
AST: 21 U/L (ref 15–41)
Albumin: 4.3 g/dL (ref 3.5–5.0)
Alkaline Phosphatase: 59 U/L (ref 38–126)
Anion gap: 14 (ref 5–15)
BUN: 9 mg/dL (ref 8–23)
CO2: 25 mmol/L (ref 22–32)
Calcium: 10.1 mg/dL (ref 8.9–10.3)
Chloride: 91 mmol/L — ABNORMAL LOW (ref 98–111)
Creatinine, Ser: 0.63 mg/dL (ref 0.44–1.00)
GFR, Estimated: >60 mL/min (ref >60)
Glucose, Bld: 132 mg/dL — ABNORMAL HIGH (ref 70–99)
Potassium: 3.7 mmol/L (ref 3.5–5.1)
Sodium: 130 mmol/L — ABNORMAL LOW (ref 135–145)
Total Bilirubin: 0.8 mg/dL (ref 0.0–1.2)
Total Protein: 7.7 g/dL (ref 6.5–8.1)

## 2023-11-04 LAB — CBC
HCT: 43 % (ref 36.0–46.0)
Hemoglobin: 14.5 g/dL (ref 12.0–15.0)
MCH: 30.3 pg (ref 26.0–34.0)
MCHC: 33.7 g/dL (ref 30.0–36.0)
MCV: 90 fL (ref 80.0–100.0)
Platelets: 270 K/uL (ref 150–400)
RBC: 4.78 MIL/uL (ref 3.87–5.11)
RDW: 11.6 % (ref 11.5–15.5)
WBC: 4.2 K/uL (ref 4.0–10.5)
nRBC: 0 % (ref 0.0–0.2)

## 2023-11-04 LAB — GLUCOSE, CAPILLARY: Glucose-Capillary: 134 mg/dL — ABNORMAL HIGH (ref 70–99)

## 2023-11-04 NOTE — Progress Notes (Signed)
 Case: [id number] Date/Time: 11/05/23 1215   Procedure: IRRIGATION AND DEBRIDEMENT KNEE (Right: Knee)   Anesthesia type: Choice   Diagnosis: Direct infection of right knee in infectious and parasitic diseases classified elsewhere (HCC) [M01.X61]   Pre-op diagnosis: RIGHT KNEE INFECTION   Location: [facility] ROOM 08 / [facility]   Surgeons: Josefina Chew, MD       DISCUSSION: Tiffany Travis is a 63 yo female with PMH of HTN, migraines, GERD, diabetes, anxiety, depression, arthritis  Seen by PCP on 7/23 for annual exam.  Was having issues with knee pain with concern for infection but otherwise all issues stable.  Blood pressure controlled.  A1c 7.1  Seen by cardiology in 2022 for chest pain after the death of her mother.  She underwent stress testing which was normal.  VS: BP 138/66 Comment: Right arm sitting  Pulse 68   Temp 36.9 C (Oral)   Resp 20   Ht 5' 1.5 (1.562 m)   Wt 79.4 kg   SpO2 100%   BMI 32.53 kg/m   PROVIDERS: Loreli Kins, MD   LABS: Labs reviewed: Acceptable for surgery.  Mild hyponatremia (all labs ordered are listed, but only abnormal results are displayed)  Labs Reviewed  COMPREHENSIVE METABOLIC PANEL WITH GFR - Abnormal; Notable for the following components:      Result Value   Sodium 130 (*)    Chloride 91 (*)    Glucose, Bld 132 (*)    All other components within normal limits  GLUCOSE, CAPILLARY - Abnormal; Notable for the following components:   Glucose-Capillary 134 (*)    All other components within normal limits  CBC     IMAGES:   EKG 11/04/23:  Normal sinus rhythm, rate 69  CV: Stress test 01/12/2021:     No ST deviation was noted.   Prior study not available for comparison.   IMPRESSIONS Negative for stress induced arrhythmias. Hypertensive response   RECOMMENDATIONS/CONCLUSIONS Stress ECG negative at target heart rate. Past Medical History:  Diagnosis Date   Anxiety    Arthritis    Depression    DM (diabetes mellitus) (HCC)     GERD (gastroesophageal reflux disease)    Hyperlipidemia    Hypertension    Insomnia    Menopausal and female climacteric states    Migraine    Obesity    Osteopenia    Tendon injury    left small finger   Wears contact lenses     Past Surgical History:  Procedure Laterality Date   CLOSED REDUCTION FINGER WITH PERCUTANEOUS PINNING Left 12/30/2014   Procedure: LEFT SMALL FINGER CLOSED REDUCTION AND PINNING;  Surgeon: Prentice Pagan, MD;  Location: [facility];  Service: Orthopedics;  Laterality: Left;   ORIF RIGHT LONG FINGER FX AND TENOLYSIS  12-13-2009    MEDICATIONS:  acetaminophen  (TYLENOL ) 650 MG CR tablet   atenolol-chlorthalidone (TENORETIC) 50-25 MG tablet   Chlorpheniramine-PSE-Ibuprofen (ADVIL ALLERGY SINUS) 2-30-200 MG TABS   lisinopril (ZESTRIL) 10 MG tablet   Multiple Vitamins-Minerals (MULTIVITAMIN WITH MINERALS) tablet   omeprazole (PRILOSEC OTC) 20 MG tablet   pregabalin  (LYRICA ) 75 MG capsule   simvastatin (ZOCOR) 20 MG tablet   traMADol (ULTRAM) 50 MG tablet   Turmeric 500 MG CAPS   No current facility-administered medications for this encounter.    Burnard CHRISTELLA Odis DEVONNA [facility] Short Stay/Anesthesiology [facility] Phone [phone] 11/04/2023 10:36 AM

## 2023-11-04 NOTE — Anesthesia Preprocedure Evaluation (Addendum)
 Anesthesia Evaluation  Patient identified by MRN, date of birth, ID band Patient awake    Reviewed: Allergy & Precautions, NPO status , Patient's Chart, lab work & pertinent test results  Airway Mallampati: II  TM Distance: >3 FB     Dental no notable dental hx. (+) Teeth Intact, Caps, Dental Advisory Given   Pulmonary neg pulmonary ROS   Pulmonary exam normal breath sounds clear to auscultation       Cardiovascular hypertension, Pt. on medications Normal cardiovascular exam Rhythm:Regular Rate:Normal     Neuro/Psych  Headaches PSYCHIATRIC DISORDERS Anxiety Depression       GI/Hepatic Neg liver ROS,GERD  ,,  Endo/Other  diabetes, Well Controlled, Type 2  Obesity HLD  Renal/GU negative Renal ROS  negative genitourinary   Musculoskeletal  (+) Arthritis , Osteoarthritis,  Infected right knee S/P I&D Baker's cyst   Abdominal  (+) + obese  Peds  Hematology negative hematology ROS (+)   Anesthesia Other Findings   Reproductive/Obstetrics                              Anesthesia Physical Anesthesia Plan  ASA: 2  Anesthesia Plan: General   Post-op Pain Management: Dilaudid  IV, Precedex and Tylenol  PO (pre-op)*   Induction: Intravenous  PONV Risk Score and Plan: 4 or greater and Treatment may vary due to age or medical condition, Propofol  infusion and Ondansetron   Airway Management Planned: Oral ETT  Additional Equipment: None  Intra-op Plan:   Post-operative Plan: Extubation in OR  Informed Consent: I have reviewed the patients History and Physical, chart, labs and discussed the procedure including the risks, benefits and alternatives for the proposed anesthesia with the patient or authorized representative who has indicated his/her understanding and acceptance.     Dental advisory given  Plan Discussed with: CRNA and Anesthesiologist  Anesthesia Plan Comments: (See PAT note  from 8/11)         Anesthesia Quick Evaluation

## 2023-11-04 NOTE — Patient Instructions (Signed)
 SURGICAL WAITING ROOM VISITATION Patients having surgery or a procedure may have no more than 2 support people in the waiting area - these visitors may rotate in the visitor waiting room.   If the patient needs to stay at the hospital during part of their recovery, the visitor guidelines for inpatient rooms apply.  PRE-OP VISITATION  Pre-op nurse will coordinate an appropriate time for 1 support person to accompany the patient in pre-op.  This support person may not rotate.  This visitor will be contacted when the time is appropriate for the visitor to come back in the pre-op area.  Please refer to the [facility] website for the visitor guidelines for Inpatients (after your surgery is over and you are in a regular room).  You are not required to quarantine at this time prior to your surgery. However, you must do this: Hand Hygiene often Do NOT share personal items Notify your provider if you are in close contact with someone who has COVID or you develop fever 100.4 or greater, new onset of sneezing, cough, sore throat, shortness of breath or body aches.  If you test positive for Covid or have been in contact with anyone that has tested positive in the last 10 days please notify you surgeon.    Your procedure is scheduled on:  Tuesday November 05, 2023  Report to [facility] Main Entrance: [place] entrance where the [employer] is available.   Report to admitting at: 10:15  AM  Call this number if you have any questions or problems the morning of surgery [phone]  DO NOT EAT OR DRINK ANYTHING AFTER MIDNIGHT THE NIGHT PRIOR TO YOUR SURGERY / PROCEDURE.   FOLLOW  ANY ADDITIONAL PRE OP INSTRUCTIONS YOU RECEIVED FROM YOUR SURGEON'S OFFICE!!!   Oral Hygiene is also important to reduce your risk of infection.        Remember - BRUSH YOUR TEETH THE MORNING OF SURGERY WITH YOUR REGULAR TOOTHPASTE  Do NOT smoke after Midnight the night before surgery.  STOP TAKING all Vitamins,  Herbs and supplements 1 week before your surgery.   Take ONLY these medicines the morning of surgery with A SIP OF WATER: omeprazole, tramadol.                     You may not have any metal on your body including hair pins, jewelry, and body piercing  Do not wear make-up, lotions, powders, perfumes  or deodorant  Do not wear nail polish including gel and S&S, artificial / acrylic nails, or any other type of covering on natural nails including finger and toenails. If you have artificial nails, gel coating, etc., that needs to be removed by a nail salon, Please have this removed prior to surgery. Not doing so may mean that your surgery could be cancelled or delayed if the Surgeon or anesthesia staff feels like they are unable to monitor you safely.   Do not shave 48 hours prior to surgery to avoid nicks in your skin which may contribute to postoperative infections.    Contacts, Hearing Aids, dentures or bridgework may not be worn into surgery. DENTURES WILL BE REMOVED PRIOR TO SURGERY PLEASE DO NOT APPLY Poly grip OR ADHESIVES!!!   Patients discharged on the day of surgery will not be allowed to drive home.  Someone NEEDS to stay with you for the first 24 hours after anesthesia.  Do not bring your home medications to the hospital. The Pharmacy will dispense  medications listed on your medication list to you during your admission in the Hospital.  Please read over the following fact sheets you were given: IF YOU HAVE QUESTIONS ABOUT YOUR PRE-OP INSTRUCTIONS, PLEASE CALL [phone].   [place] - Preparing for Surgery Before surgery, you can play an important role.  Because skin is not sterile, your skin needs to be as free of germs as possible.  You can reduce the number of germs on your skin by washing with CHG (chlorahexidine gluconate) soap before surgery.  CHG is an antiseptic cleaner which kills germs and bonds with the skin to continue killing germs even after washing. Please DO  NOT use if you have an allergy to CHG or antibacterial soaps.  If your skin becomes reddened/irritated stop using the CHG and inform your nurse when you arrive at Short Stay. Do not shave (including legs and underarms) for at least 48 hours prior to the first CHG shower.  You may shave your face/neck.  Please follow these instructions carefully:  1.  Shower with CHG Soap the night before surgery and the  morning of surgery.  2.  If you choose to wash your hair, wash your hair first as usual with your normal  shampoo.  3.  After you shampoo, rinse your hair and body thoroughly to remove the shampoo.                             4.  Use CHG as you would any other liquid soap.  You can apply chg directly to the skin and wash.  Gently with a scrungie or clean washcloth.  5.  Apply the CHG Soap to your body ONLY FROM THE NECK DOWN.   Do not use on face/ open                           Wound or open sores. Avoid contact with eyes, ears mouth and genitals (private parts).                       Wash face,  Genitals (private parts) with your normal soap.             6.  Wash thoroughly, paying special attention to the area where your  surgery  will be performed.  7.  Thoroughly rinse your body with warm water from the neck down.  8.  DO NOT shower/wash with your normal soap after using and rinsing off the CHG Soap.            9.  Pat yourself dry with a clean towel.            10.  Wear clean pajamas.            11.  Place clean sheets on your bed the night of your first shower and do not  sleep with pets.  ON THE DAY OF SURGERY : Do not apply any lotions/deodorants the morning of surgery.  Please wear clean clothes to the hospital/surgery center.    FAILURE TO FOLLOW THESE INSTRUCTIONS MAY RESULT IN THE CANCELLATION OF YOUR SURGERY  PATIENT SIGNATURE_________________________________  NURSE  SIGNATURE__________________________________  ________________________________________________________________________

## 2023-11-04 NOTE — Progress Notes (Signed)
 COVID Vaccine received:  [x]  No []  Yes Date of any COVID positive Test in last 90 days:  none  PCP - Joen Gentry, MD at [facility]  [phone] Cardiologist - None now  Chest x-ray - 05-14-2018  2v  Epic EKG -  2022   will repeat Stress Test - 01-12-2021  Epic ECHO -  Cardiac Cath -  CT Coronary Calcium score:   Pacemaker / ICD device [x]  No []  Yes   Spinal Cord Stimulator:[x]  No []  Yes       History of Sleep Apnea? [x]  No []  Yes   CPAP used?- [x]  No []  Yes    Patient has: []  NO Hx DM   []  Pre-DM   []  DM1  [x]   DM2 Does the patient monitor blood sugar?   []  N/A   []  No []  Yes  Last A1c was: 7.1  on   10-17-23   No Medications for DM2 d/t insurance.   Blood Thinner / Instructions: none Aspirin Instructions:  none  ERAS Protocol Ordered: [x]  No  []  Yes Patient is to be NPO after:  [place] Prior      Dental hx: []  Dentures:  [x]  N/A      []  Bridge or Partial:                   []  Loose or Damaged teeth:   Comments: NO SURGEON ORDERS available at PST. VM to [place] x 2.   Activity level: Able to walk up 2 flights of stairs without becoming significantly short of breath or having chest pain?  []  No   [x]    Yes  Patient can perform ADLs without assistance. []  No   [x]   Yes  Anesthesia review: HTN, DM2, MDD, CPS  Patient denies any S&S of respiratory illness or Covid - no shortness of breath, fever, cough or chest pain at PAT appointment.  Patient verbalized understanding and agreement to the Pre-Surgical Instructions that were given to them at this PAT appointment. Patient was also educated of the need to review these PAT instructions again prior to her surgery.I reviewed the appropriate phone numbers to call if they have any and questions or concerns.

## 2023-11-05 ENCOUNTER — Encounter (HOSPITAL_COMMUNITY): Payer: Self-pay | Admitting: Orthopedic Surgery

## 2023-11-05 ENCOUNTER — Ambulatory Visit (HOSPITAL_COMMUNITY): Payer: Self-pay | Admitting: Medical

## 2023-11-05 ENCOUNTER — Other Ambulatory Visit: Payer: Self-pay

## 2023-11-05 ENCOUNTER — Ambulatory Visit (HOSPITAL_BASED_OUTPATIENT_CLINIC_OR_DEPARTMENT_OTHER): Payer: Self-pay | Admitting: Certified Registered"

## 2023-11-05 ENCOUNTER — Ambulatory Visit (HOSPITAL_COMMUNITY)
Admission: RE | Admit: 2023-11-05 | Discharge: 2023-11-05 | Disposition: A | Discharge status: Home / Self Care | Attending: Orthopedic Surgery | Admitting: Orthopedic Surgery

## 2023-11-05 ENCOUNTER — Encounter (HOSPITAL_COMMUNITY)
Admission: RE | Disposition: A | Discharge status: Home / Self Care | Payer: Self-pay | Source: Home / Self Care | Attending: Orthopedic Surgery

## 2023-11-05 DIAGNOSIS — M67461 Ganglion, right knee: Secondary | ICD-10-CM | POA: Diagnosis not present

## 2023-11-05 DIAGNOSIS — E119 Type 2 diabetes mellitus without complications: Secondary | ICD-10-CM

## 2023-11-05 DIAGNOSIS — M1711 Unilateral primary osteoarthritis, right knee: Secondary | ICD-10-CM | POA: Insufficient documentation

## 2023-11-05 DIAGNOSIS — F32A Depression, unspecified: Secondary | ICD-10-CM | POA: Diagnosis not present

## 2023-11-05 DIAGNOSIS — E785 Hyperlipidemia, unspecified: Secondary | ICD-10-CM | POA: Diagnosis not present

## 2023-11-05 DIAGNOSIS — Z79899 Other long term (current) drug therapy: Secondary | ICD-10-CM | POA: Insufficient documentation

## 2023-11-05 DIAGNOSIS — I10 Essential (primary) hypertension: Secondary | ICD-10-CM | POA: Diagnosis not present

## 2023-11-05 DIAGNOSIS — M7121 Synovial cyst of popliteal space [Baker], right knee: Secondary | ICD-10-CM | POA: Diagnosis not present

## 2023-11-05 DIAGNOSIS — M01X61 Direct infection of right knee in infectious and parasitic diseases classified elsewhere: Secondary | ICD-10-CM

## 2023-11-05 DIAGNOSIS — E669 Obesity, unspecified: Secondary | ICD-10-CM | POA: Diagnosis not present

## 2023-11-05 DIAGNOSIS — F419 Anxiety disorder, unspecified: Secondary | ICD-10-CM | POA: Insufficient documentation

## 2023-11-05 DIAGNOSIS — K219 Gastro-esophageal reflux disease without esophagitis: Secondary | ICD-10-CM | POA: Diagnosis not present

## 2023-11-05 DIAGNOSIS — Z01818 Encounter for other preprocedural examination: Secondary | ICD-10-CM

## 2023-11-05 HISTORY — PX: IRRIGATION AND DEBRIDEMENT KNEE: SHX5185

## 2023-11-05 LAB — GLUCOSE, CAPILLARY: Glucose-Capillary: 120 mg/dL — ABNORMAL HIGH (ref 70–99)

## 2023-11-05 SURGERY — IRRIGATION AND DEBRIDEMENT KNEE
Anesthesia: General | Site: Knee | Laterality: Right

## 2023-11-05 MED ORDER — PROPOFOL 10 MG/ML IV BOLUS
INTRAVENOUS | Status: AC
  Filled 2023-11-05: qty 20

## 2023-11-05 MED ORDER — DEXAMETHASONE SODIUM PHOSPHATE 10 MG/ML IJ SOLN
INTRAMUSCULAR | Status: DC | PRN
Start: 2023-11-05 — End: 2023-11-05
  Administered 2023-11-05 (×2): 4 mg via INTRAVENOUS

## 2023-11-05 MED ORDER — BUPIVACAINE HCL (PF) 0.25 % IJ SOLN
INTRAMUSCULAR | Status: DC | PRN
Start: 2023-11-05 — End: 2023-11-05
  Administered 2023-11-05 (×2): 30 mL

## 2023-11-05 MED ORDER — 0.9 % SODIUM CHLORIDE (POUR BTL) OPTIME: TOPICAL | Status: DC | PRN

## 2023-11-05 MED ORDER — PHENYLEPHRINE 80 MCG/ML (10ML) SYRINGE FOR IV PUSH (FOR BLOOD PRESSURE SUPPORT)
PREFILLED_SYRINGE | INTRAVENOUS | Status: AC
  Filled 2023-11-05: qty 10

## 2023-11-05 MED ORDER — ACETAMINOPHEN 500 MG PO TABS
1000.0000 mg | ORAL_TABLET | Freq: Once | ORAL | Status: DC
  Filled 2023-11-05: qty 2

## 2023-11-05 MED ORDER — LIDOCAINE HCL (CARDIAC) PF 100 MG/5ML IV SOSY
PREFILLED_SYRINGE | INTRAVENOUS | Status: DC | PRN
  Administered 2023-11-05 (×2): 40 mg via INTRAVENOUS

## 2023-11-05 MED ORDER — PROPOFOL 10 MG/ML IV BOLUS
INTRAVENOUS | Status: DC | PRN
  Administered 2023-11-05 (×2): 140 mg via INTRAVENOUS

## 2023-11-05 MED ORDER — CHLORHEXIDINE GLUCONATE 0.12 % MT SOLN
15.0000 mL | Freq: Once | OROMUCOSAL | Status: AC
  Administered 2023-11-05 (×2): 15 mL via OROMUCOSAL

## 2023-11-05 MED ORDER — OXYCODONE HCL 5 MG/5ML PO SOLN: 5.0000 mg | Freq: Once | ORAL | Status: DC | PRN

## 2023-11-05 MED ORDER — HYDROCODONE-ACETAMINOPHEN 5-325 MG PO TABS: 1.0000 | ORAL_TABLET | Freq: Four times a day (QID) | ORAL | 0 refills | Status: AC | PRN

## 2023-11-05 MED ORDER — SUGAMMADEX SODIUM 200 MG/2ML IV SOLN
INTRAVENOUS | Status: AC
  Filled 2023-11-05: qty 2

## 2023-11-05 MED ORDER — ONDANSETRON HCL 4 MG/2ML IJ SOLN
INTRAMUSCULAR | Status: AC
  Filled 2023-11-05: qty 2

## 2023-11-05 MED ORDER — DEXAMETHASONE SODIUM PHOSPHATE 10 MG/ML IJ SOLN
INTRAMUSCULAR | Status: AC
  Filled 2023-11-05: qty 1

## 2023-11-05 MED ORDER — ONDANSETRON HCL 4 MG/2ML IJ SOLN
4.0000 mg | Freq: Once | INTRAMUSCULAR | Status: AC | PRN
  Administered 2023-11-05 (×2): 4 mg via INTRAVENOUS

## 2023-11-05 MED ORDER — EPHEDRINE SULFATE-NACL 50-0.9 MG/10ML-% IV SOSY
PREFILLED_SYRINGE | INTRAVENOUS | Status: DC | PRN
  Administered 2023-11-05 (×4): 5 mg via INTRAVENOUS

## 2023-11-05 MED ORDER — LACTATED RINGERS IV SOLN: INTRAVENOUS | Status: DC

## 2023-11-05 MED ORDER — DROPERIDOL 2.5 MG/ML IJ SOLN: 0.6250 mg | Freq: Once | INTRAMUSCULAR | Status: DC | PRN

## 2023-11-05 MED ORDER — HYDROMORPHONE HCL 1 MG/ML IJ SOLN: 0.2500 mg | INTRAMUSCULAR | Status: DC | PRN

## 2023-11-05 MED ORDER — ORAL CARE MOUTH RINSE: 15.0000 mL | Freq: Once | OROMUCOSAL | Status: AC

## 2023-11-05 MED ORDER — MIDAZOLAM HCL 2 MG/2ML IJ SOLN
INTRAMUSCULAR | Status: DC | PRN
  Administered 2023-11-05 (×2): 2 mg via INTRAVENOUS

## 2023-11-05 MED ORDER — SODIUM CHLORIDE 0.9 % IR SOLN
Status: DC | PRN
  Administered 2023-11-05 (×2): 3000 mL

## 2023-11-05 MED ORDER — LIDOCAINE HCL (PF) 2 % IJ SOLN
INTRAMUSCULAR | Status: AC
  Filled 2023-11-05: qty 5

## 2023-11-05 MED ORDER — CEFAZOLIN SODIUM-DEXTROSE 2-3 GM-%(50ML) IV SOLR
INTRAVENOUS | Status: DC | PRN
Start: 2023-11-05 — End: 2023-11-05
  Administered 2023-11-05 (×2): 2 g via INTRAVENOUS

## 2023-11-05 MED ORDER — MIDAZOLAM HCL 2 MG/2ML IJ SOLN
INTRAMUSCULAR | Status: AC
  Filled 2023-11-05: qty 2

## 2023-11-05 MED ORDER — BUPIVACAINE HCL (PF) 0.25 % IJ SOLN
INTRAMUSCULAR | Status: AC
  Filled 2023-11-05: qty 30

## 2023-11-05 MED ORDER — FENTANYL CITRATE (PF) 100 MCG/2ML IJ SOLN
INTRAMUSCULAR | Status: DC | PRN
  Administered 2023-11-05 (×8): 50 ug via INTRAVENOUS

## 2023-11-05 MED ORDER — FENTANYL CITRATE (PF) 100 MCG/2ML IJ SOLN
INTRAMUSCULAR | Status: AC
  Filled 2023-11-05: qty 2

## 2023-11-05 MED ORDER — SUGAMMADEX SODIUM 200 MG/2ML IV SOLN
INTRAVENOUS | Status: DC | PRN
  Administered 2023-11-05 (×2): 160 mg via INTRAVENOUS

## 2023-11-05 MED ORDER — PHENYLEPHRINE 80 MCG/ML (10ML) SYRINGE FOR IV PUSH (FOR BLOOD PRESSURE SUPPORT)
PREFILLED_SYRINGE | INTRAVENOUS | Status: DC | PRN
Start: 2023-11-05 — End: 2023-11-05
  Administered 2023-11-05 (×2): 40 ug via INTRAVENOUS
  Administered 2023-11-05: 80 ug via INTRAVENOUS
  Administered 2023-11-05 (×2): 40 ug via INTRAVENOUS
  Administered 2023-11-05: 80 ug via INTRAVENOUS
  Administered 2023-11-05 (×2): 40 ug via INTRAVENOUS
  Administered 2023-11-05: 80 ug via INTRAVENOUS
  Administered 2023-11-05: 40 ug via INTRAVENOUS
  Administered 2023-11-05: 80 ug via INTRAVENOUS
  Administered 2023-11-05: 40 ug via INTRAVENOUS

## 2023-11-05 MED ORDER — EPHEDRINE 5 MG/ML INJ
INTRAVENOUS | Status: AC
  Filled 2023-11-05: qty 5

## 2023-11-05 MED ORDER — OXYCODONE HCL 5 MG PO TABS: 5.0000 mg | ORAL_TABLET | Freq: Once | ORAL | Status: DC | PRN

## 2023-11-05 MED ORDER — DOXYCYCLINE HYCLATE 50 MG PO CAPS: 100.0000 mg | ORAL_CAPSULE | Freq: Two times a day (BID) | ORAL | 0 refills | Status: AC

## 2023-11-05 MED ORDER — ROCURONIUM BROMIDE 10 MG/ML (PF) SYRINGE
PREFILLED_SYRINGE | INTRAVENOUS | Status: DC | PRN
  Administered 2023-11-05 (×2): 40 mg via INTRAVENOUS

## 2023-11-05 MED ORDER — CEFAZOLIN SODIUM 1 G IJ SOLR
INTRAMUSCULAR | Status: AC
  Filled 2023-11-05: qty 20

## 2023-11-05 SURGICAL SUPPLY — 54 items
BLADE SURG 15 STRL LF DISP TIS (BLADE) ×1 IMPLANT
BNDG COHESIVE 4X5 TAN STRL LF (GAUZE/BANDAGES/DRESSINGS) ×1 IMPLANT
BNDG COHESIVE 6X5 TAN ST LF (GAUZE/BANDAGES/DRESSINGS) IMPLANT
BNDG COMPR ESMARK 6X3 LF (GAUZE/BANDAGES/DRESSINGS) ×1 IMPLANT
BNDG ELASTIC 4INX 5YD STR LF (GAUZE/BANDAGES/DRESSINGS) ×1 IMPLANT
BNDG ELASTIC 6INX 5YD STR LF (GAUZE/BANDAGES/DRESSINGS) IMPLANT
BRUSH SCRUB EZ PLAIN DRY (MISCELLANEOUS) IMPLANT
CLSR STERI-STRIP ANTIMIC 1/2X4 (GAUZE/BANDAGES/DRESSINGS) IMPLANT
CNTNR URN SCR LID CUP LEK RST (MISCELLANEOUS) IMPLANT
COVER BACK TABLE 60X90IN (DRAPES) ×1 IMPLANT
CUFF TRNQT CYL 34X4.125X (TOURNIQUET CUFF) IMPLANT
DRAPE EXTREMITY T 121X128X90 (DISPOSABLE) ×1 IMPLANT
DRAPE IMP U-DRAPE 54X76 (DRAPES) ×1 IMPLANT
DRAPE INCISE IOBAN 66X45 STRL (DRAPES) IMPLANT
DRAPE OEC MINIVIEW 54X84 (DRAPES) IMPLANT
DRAPE U-SHAPE 47X51 STRL (DRAPES) ×1 IMPLANT
DURAPREP 26ML APPLICATOR (WOUND CARE) IMPLANT
ELECT PENCIL ROCKER SW 15FT (MISCELLANEOUS) IMPLANT
ELECTRODE REM PT RTRN 9FT ADLT (ELECTROSURGICAL) ×1 IMPLANT
GAUZE PAD ABD 8X10 STRL (GAUZE/BANDAGES/DRESSINGS) IMPLANT
GAUZE SPONGE 4X4 12PLY STRL (GAUZE/BANDAGES/DRESSINGS) ×1 IMPLANT
GAUZE XEROFORM 1X8 LF (GAUZE/BANDAGES/DRESSINGS) IMPLANT
GLOVE BIO SURGEON STRL SZ7 (GLOVE) ×1 IMPLANT
GLOVE BIO SURGEON STRL SZ8.5 (GLOVE) ×1 IMPLANT
GLOVE BIOGEL PI IND STRL 7.0 (GLOVE) ×1 IMPLANT
GLOVE BIOGEL PI IND STRL 8 (GLOVE) ×2 IMPLANT
GLOVE ORTHO TXT STRL SZ7.5 (GLOVE) ×1 IMPLANT
GOWN STRL REUS W/ TWL LRG LVL3 (GOWN DISPOSABLE) ×2 IMPLANT
IMMOBILIZER KNEE 20 THIGH 36 (SOFTGOODS) IMPLANT
KIT BASIN OR (CUSTOM PROCEDURE TRAY) ×1 IMPLANT
NDL HYPO 25X1 1.5 SAFETY (NEEDLE) IMPLANT
NEEDLE HYPO 25X1 1.5 SAFETY (NEEDLE) IMPLANT
NS IRRIG 1000ML POUR BTL (IV SOLUTION) ×1 IMPLANT
PAD CAST 4YDX4 CTTN HI CHSV (CAST SUPPLIES) ×1 IMPLANT
SET CYSTO W/LG BORE CLAMP LF (SET/KITS/TRAYS/PACK) IMPLANT
SET HNDPC FAN SPRY TIP SCT (DISPOSABLE) IMPLANT
SLEEVE SCD COMPRESS KNEE MED (STOCKING) IMPLANT
SPIKE FLUID TRANSFER (MISCELLANEOUS) IMPLANT
SPLINT PLASTER CAST FAST 5X30 (CAST SUPPLIES) IMPLANT
SPONGE T-LAP 18X18 ~~LOC~~+RFID (SPONGE) IMPLANT
SPONGE T-LAP 4X18 ~~LOC~~+RFID (SPONGE) ×1 IMPLANT
STAPLER SKIN PROX WIDE 3.9 (STAPLE) IMPLANT
STOCKINETTE 8 INCH (MISCELLANEOUS) IMPLANT
SUT ETHILON 3 0 PS 1 (SUTURE) IMPLANT
SUT ETHILON 4 0 PS 2 18 (SUTURE) IMPLANT
SUT MNCRL AB 4-0 PS2 18 (SUTURE) IMPLANT
SUT VIC AB 0 CT1 27XBRD ANBCTR (SUTURE) IMPLANT
SUT VIC AB 2-0 SH 18 (SUTURE) IMPLANT
SUT VIC AB 3-0 SH 27X BRD (SUTURE) IMPLANT
SUT VIC AB 4-0 PS2 18 (SUTURE) IMPLANT
SUT VICRYL 3-0 CR8 SH (SUTURE) IMPLANT
SWAB COLLECTION DEVICE MRSA (MISCELLANEOUS) IMPLANT
SWAB CULTURE ESWAB REG 1ML (MISCELLANEOUS) IMPLANT
SYR BULB EAR ULCER 3OZ GRN STR (SYRINGE) ×1 IMPLANT

## 2023-11-05 NOTE — Anesthesia Procedure Notes (Signed)
 Procedure Name: Intubation Date/Time: 11/05/2023 1:31 PM  Performed by: Metta Andrea NOVAK, CRNAPre-anesthesia Checklist: Patient identified, Emergency Drugs available, Suction available, Patient being monitored and Timeout performed Patient Re-evaluated:Patient Re-evaluated prior to induction Oxygen Delivery Method: Circle system utilized Preoxygenation: Pre-oxygenation with 100% oxygen Induction Type: IV induction Ventilation: Mask ventilation without difficulty Laryngoscope Size: Mac and 3 Grade View: Grade I Tube type: Oral Tube size: 7.0 mm Number of attempts: 1 Airway Equipment and Method: Stylet Placement Confirmation: ETT inserted through vocal cords under direct vision, positive ETCO2 and breath sounds checked- equal and bilateral Secured at: 21 cm Tube secured with: Tape Dental Injury: Teeth and Oropharynx as per pre-operative assessment

## 2023-11-05 NOTE — Op Note (Signed)
 11/05/2023  2:33 PM  PATIENT:  Tiffany Travis    PRE-OPERATIVE DIAGNOSIS:  RIGHT popliteal fossa ganglion cyst/Baker's cyst with persistent drainage  POST-OPERATIVE DIAGNOSIS:  Same  PROCEDURE: Excisional debridement of popliteal fossa Baker's cyst  SURGEON:  Fonda SHAUNNA Olmsted, MD  PHYSICIAN ASSISTANT: Army Daring, PA-C, present and scrubbed throughout the case, critical for completion in a timely fashion, and for retraction, instrumentation, and closure.  ANESTHESIA:   General  PREOPERATIVE INDICATIONS:  Tiffany Travis is a  63 y.o. female who had a posterior popliteal fossa Baker's cyst and had undergone 2 previous ultrasound-guided aspirations, the last one was approximately 3 months ago, and unfortunately she has had persistent drainage of cystic fluid from the pin tract from the aspiration.  We cultured this in the office and it did not demonstrate any infection, but given the persistent drainage and the fact that she wishes to undergo total knee arthroplasty, I recommended excisional debridement of the cyst in order to prepare for the potential for future arthroplasty.  The risks benefits and alternatives were discussed with the patient preoperatively including but not limited to the risks of infection, bleeding, nerve injury, cardiopulmonary complications, the need for revision surgery, among others, and the patient was willing to proceed.  We also discussed the risks for recurrence of the cyst, persistent infection, the need for revision surgery.  ESTIMATED BLOOD LOSS: Minimal  OPERATIVE IMPLANTS:   * No implants in log *  OPERATIVE FINDINGS: She had a complex ganglion cyst that was superficial to the fascia, that was fairly sizable, probably at least 5 x 4 cm, and it appeared to track down to the fascia, and I identified a small pinhole where there was a stalk going deep.  It did not look purulent.  OPERATIVE PROCEDURE: The patient was brought to the operating room and placed in  the supine position.  General anesthesia was administered.  She was turned into the prone position and the right lower extremity was prepped and draped in usual sterile fashion.  Timeout performed.  Transverse incision was made across the popliteal crease, directly overlying the palpable mass.  Dissection was carried down and I encountered the cyst.  This was fairly sizable.  This was removed, and sent for Gram stain culture and sensitivity.  I dissected this free both medially and laterally.  Care was taken not to penetrate the deep fascia, I did identify a small pinhole where it looked like the cyst was originating, and I elevated the fascia and with great care through a single 2-0 Vicryl suture to close that pinhole.  I then irrigated 3 L of normal saline through the wound, and then repaired the subcutaneous tissue with Vicryl followed by nylon for the skin.  I also injected the skin with half percent Marcaine .  The tourniquet was released prior to closure and I had excellent hemostasis.  She tolerated the procedure well and there were no complications.  Debridement type: Excisional Debridement  Side: right  Body Location: Popliteal fossa  Tools used for debridement: scalpel, scissors, and rongeur  Pre-debridement Wound size (cm):   Length: 0        width: 0     depth: 0  Post-debridement Wound size (cm):   Length: 0        width: 0     depth: 0  Debridement depth beyond dead/damaged tissue down to healthy viable tissue: yes  Tissue layer involved: skin, subcutaneous tissue, muscle / fascia  Nature of tissue  removed: Slough and Other: Ganglion cystic fluid with fascial covering  Irrigation volume: 3 L     Irrigation fluid type: Normal Saline

## 2023-11-05 NOTE — Discharge Instructions (Signed)
 Diet: As you were doing prior to hospitalization   Shower:  May shower but keep the wounds dry, use an occlusive plastic wrap, NO SOAKING IN TUB.  If the bandage gets wet, change with a clean dry gauze.   Dressing:  Keep dressing in place until follow up appointment. We will plan to remove your stitches in about 2-3 weeks.   Activity:  Increase activity slowly as tolerated, but follow the weight bearing instructions below.  The rules on driving is that you can not be taking narcotics while you drive, and you must feel in control of the vehicle.    Weight Bearing:   weight bearing as tolerated. Do not leave your leg in bent position for long periods of time. Use knee immobilizer for comfort.    To prevent constipation: you may use a stool softener such as -  Colace (over the counter) 100 mg by mouth twice a day  Drink plenty of fluids (prune juice may be helpful) and high fiber foods Miralax (over the counter) for constipation as needed.    Itching:  If you experience itching with your medications, try taking only a single pain pill, or even half a pain pill at a time.  You may take up to 10 pain pills per day, and you can also use benadryl over the counter for itching or also to help with sleep.   Precautions:  If you experience chest pain or shortness of breath - call 911 immediately for transfer to the hospital emergency department!!  If you develop a fever greater that 101 F, purulent drainage from wound, increased redness or drainage from wound, or calf pain -- Call the office at [phone]                                                Follow- Up Appointment:  Please call for an appointment to be seen in 2 weeks [place] - [phone]

## 2023-11-05 NOTE — H&P (Signed)
 PREOPERATIVE H&P  Chief Complaint: right knee pain and swelling  HPI: Tiffany Travis is a 63 y.o. female here regarding her right knee.  She has known right knee patellofemoral arthrosis.  She is planning to have an arthroplasty next October.  She has had 2 ultrasound-guided aspirations of her brace versus and she continues to have persistent swelling and drainage from that site.  This is significantly impairing activities of daily living.  MRI was performed.  It demonstrates a fluid collection that is still present that is about 3 cm in size in the subcutaneous area of the popliteal fossa.  This may have communicated to a ruptured Baker's cyst.   Past Medical History:  Diagnosis Date   Anxiety    Arthritis    Depression    DM (diabetes mellitus) (HCC)    GERD (gastroesophageal reflux disease)    Hyperlipidemia    Hypertension    Insomnia    Menopausal and female climacteric states    Migraine    Obesity    Osteopenia    Tendon injury    left small finger   Wears contact lenses    Past Surgical History:  Procedure Laterality Date   CLOSED REDUCTION FINGER WITH PERCUTANEOUS PINNING Left 12/30/2014   Procedure: LEFT SMALL FINGER CLOSED REDUCTION AND PINNING;  Surgeon: Prentice Pagan, MD;  Location: [facility] [place];  Service: Orthopedics;  Laterality: Left;   ORIF RIGHT LONG FINGER FX AND TENOLYSIS  12-13-2009   Social History   Socioeconomic History   Marital status: Married    Spouse name: Not on file   Number of children: Not on file   Years of education: Not on file   Highest education level: Not on file  Occupational History   Not on file  Tobacco Use   Smoking status: Never   Smokeless tobacco: Never  Vaping Use   Vaping status: Never Used  Substance and Sexual Activity   Alcohol use: No   Drug use: No   Sexual activity: Not Currently  Other Topics Concern   Not on file  Social History Narrative   Not on file   Social Drivers of Health   Financial  Resource Strain: Not on file  Food Insecurity: Not on file  Transportation Needs: Not on file  Physical Activity: Not on file  Stress: Not on file  Social Connections: Not on file   No family history on file. No Known Allergies Prior to Admission medications   Medication Sig Start Date End Date Taking? Authorizing Provider  acetaminophen  (TYLENOL ) 650 MG CR tablet Take 1,300 mg by mouth 2 (two) times daily.   Yes [provider]  atenolol-chlorthalidone (TENORETIC) 50-25 MG tablet Take 0.5 tablets by mouth every evening.    Yes [provider]  Chlorpheniramine-PSE-Ibuprofen (ADVIL ALLERGY SINUS) 2-30-200 MG TABS Take 1 tablet by mouth daily as needed (allergies).   Yes [provider]  lisinopril (ZESTRIL) 10 MG tablet Take 10 mg by mouth daily. 01/08/21  Yes [provider]  Multiple Vitamins-Minerals (MULTIVITAMIN WITH MINERALS) tablet Take 1 tablet by mouth daily.   Yes [provider]  omeprazole (PRILOSEC OTC) 20 MG tablet Take 20 mg by mouth daily.   Yes [provider]  simvastatin (ZOCOR) 20 MG tablet Take 20 mg by mouth every evening.   Yes [provider]  traMADol (ULTRAM) 50 MG tablet Take 50 mg by mouth every 6 (six) hours as needed for moderate pain (pain score 4-6) or  severe pain (pain score 7-10). 10/28/23  Yes [provider]  Turmeric 500 MG CAPS Take 500 mg by mouth daily.   Yes [provider]  pregabalin  (LYRICA ) 75 MG capsule TAKE 1 CAPSULE BY MOUTH 2 TIMES DAILY 10/09/23   McCaughan, Dia D, DPM     Positive ROS: All other systems have been reviewed and were otherwise negative with the exception of those mentioned in the HPI and as above.  Physical Exam: General: Alert, no acute distress Cardiovascular: No pedal edema Respiratory: No cyanosis, no use of accessory musculature GI: No organomegaly, abdomen is soft and non-tender Skin: No lesions in the area of chief complaint Neurologic:  Sensation intact distally Psychiatric: Patient is competent for consent with normal mood and affect Lymphatic: No axillary or cervical lymphadenopathy  MUSCULOSKELETAL: Range of motion 0 to 115 degrees with positive patellar crepitance.  It looks like she has a very pinpoint sinus tract from the popliteal fossa.  There is a slight amount of soft tissue fluid accumulation in that area.  Tender to the area.  MRI demonstrates a fluid collection that is still present that is about 3 cm in size in the subcutaneous area of the popliteal fossa.    Assessment: Right knee subcutaneous abscess   Plan: We recommend performing a surgical debridement of the subcutaneous fluid collection.  We will have to see whether we can get her healed adequately for surgery in the next couple months.  Plan for posterior I&D of subcutaneous abscess in the popliteal fossa.  Plan for Procedure(s): IRRIGATION AND DEBRIDEMENT KNEE  The risks benefits and alternatives were discussed with the patient including but not limited to the risks of nonoperative treatment, versus surgical intervention including infection, bleeding, nerve injury,  blood clots, cardiopulmonary complications, morbidity, mortality, among others, and they were willing to proceed.    Army MARLA Daring, PA-C    11/05/2023 10:08 AM

## 2023-11-05 NOTE — Transfer of Care (Signed)
 Immediate Anesthesia Transfer of Care Note  Patient: Tiffany Travis  Procedure(s) Performed: IRRIGATION AND DEBRIDEMENT RIGHT KNEE WITH EXCISION OF CYST (Right: Knee)  Patient Location: PACU  Anesthesia Type:General  Level of Consciousness: awake, alert , and patient cooperative  Airway & Oxygen Therapy: Patient Spontanous Breathing and Patient connected to face mask oxygen  Post-op Assessment: Report given to RN and Post -op Vital signs reviewed and stable  Post vital signs: Reviewed and stable  Last Vitals:  Vitals Value Taken Time  BP 132/64 11/05/23 14:37  Temp    Pulse 80 11/05/23 14:39  Resp 13 11/05/23 14:39  SpO2 100 % 11/05/23 14:39  Vitals shown include unfiled device data.  Last Pain:  Vitals:   11/05/23 1043  TempSrc: Oral  PainSc:          Complications: No notable events documented.

## 2023-11-05 NOTE — Anesthesia Postprocedure Evaluation (Signed)
 Anesthesia Post Note  Patient: Tiffany Travis  Procedure(s) Performed: IRRIGATION AND DEBRIDEMENT RIGHT KNEE WITH EXCISION OF CYST (Right: Knee)     Patient location during evaluation: PACU Anesthesia Type: General Level of consciousness: awake and alert and oriented Pain management: pain level controlled Vital Signs Assessment: post-procedure vital signs reviewed and stable Respiratory status: spontaneous breathing, nonlabored ventilation and respiratory function stable Cardiovascular status: blood pressure returned to baseline and stable Postop Assessment: no apparent nausea or vomiting Anesthetic complications: no   No notable events documented.  Last Vitals:  Vitals:   11/05/23 1445 11/05/23 1449  BP: 125/62   Pulse: 84 76  Resp: 11 15  Temp:    SpO2: 100% 96%    Last Pain:  Vitals:   11/05/23 1445  TempSrc:   PainSc: 0-No pain                 Juliett Eastburn A.

## 2023-11-06 ENCOUNTER — Encounter (HOSPITAL_COMMUNITY): Payer: Self-pay | Admitting: Orthopedic Surgery

## 2023-11-06 LAB — SURGICAL PATHOLOGY
